# Patient Record
Sex: Male | Born: 1990 | Hispanic: Yes | Marital: Single | Smoking: Current some day smoker
Health system: Southern US, Community
[De-identification: ages and names within clinical notes are randomized; demographics above are authoritative.]

## PROBLEM LIST (undated history)

## (undated) DIAGNOSIS — F329 Major depressive disorder, single episode, unspecified: Secondary | ICD-10-CM

## (undated) DIAGNOSIS — G2579 Other drug induced movement disorders: Secondary | ICD-10-CM

## (undated) DIAGNOSIS — F32A Depression, unspecified: Secondary | ICD-10-CM

## (undated) DIAGNOSIS — K589 Irritable bowel syndrome without diarrhea: Secondary | ICD-10-CM

## (undated) DIAGNOSIS — F1910 Other psychoactive substance abuse, uncomplicated: Secondary | ICD-10-CM

## (undated) DIAGNOSIS — F640 Transsexualism: Secondary | ICD-10-CM

## (undated) DIAGNOSIS — F419 Anxiety disorder, unspecified: Secondary | ICD-10-CM

## (undated) DIAGNOSIS — J189 Pneumonia, unspecified organism: Secondary | ICD-10-CM

## (undated) DIAGNOSIS — Z789 Other specified health status: Secondary | ICD-10-CM

## (undated) HISTORY — PX: APPENDECTOMY: SHX54

---

## 2010-04-19 ENCOUNTER — Emergency Department (INDEPENDENT_AMBULATORY_CARE_PROVIDER_SITE_OTHER): Payer: Managed Care, Other (non HMO)

## 2010-04-19 ENCOUNTER — Emergency Department (HOSPITAL_BASED_OUTPATIENT_CLINIC_OR_DEPARTMENT_OTHER)
Admission: EM | Admit: 2010-04-19 | Discharge: 2010-04-19 | Disposition: A | Discharge status: Nursing Home (Includes ICF) | Payer: Managed Care, Other (non HMO) | Attending: Emergency Medicine | Admitting: Emergency Medicine

## 2010-04-19 DIAGNOSIS — R109 Unspecified abdominal pain: Secondary | ICD-10-CM

## 2010-04-19 DIAGNOSIS — K3580 Unspecified acute appendicitis: Secondary | ICD-10-CM | POA: Insufficient documentation

## 2010-04-19 LAB — BASIC METABOLIC PANEL
Calcium: 9.8 mg/dL (ref 8.4–10.5)
Chloride: 101 mEq/L (ref 96–112)
Creatinine, Ser: 1 mg/dL (ref 0.4–1.5)
GFR calc Af Amer: >60 mL/min (ref >60)

## 2010-04-19 LAB — DIFFERENTIAL
Basophils Absolute: 0 10*3/uL (ref 0.0–0.1)
Basophils Relative: 0 % (ref 0–1)
Eosinophils Absolute: 0.1 10*3/uL (ref 0.0–0.7)
Monocytes Relative: 6 % (ref 3–12)
Neutrophils Relative %: 80 % — ABNORMAL HIGH (ref 43–77)

## 2010-04-19 LAB — URINALYSIS, ROUTINE W REFLEX MICROSCOPIC
Hgb urine dipstick: NEGATIVE
Nitrite: NEGATIVE
Protein, ur: NEGATIVE mg/dL
Urobilinogen, UA: 0.2 mg/dL (ref 0.0–1.0)

## 2010-04-19 LAB — CBC
MCH: 31.4 pg (ref 26.0–34.0)
MCHC: 35 g/dL (ref 30.0–36.0)
Platelets: 184 10*3/uL (ref 150–400)
RBC: 4.24 MIL/uL (ref 4.22–5.81)

## 2010-04-19 MED ORDER — IOHEXOL 300 MG/ML  SOLN
100.0000 mL | Freq: Once | INTRAMUSCULAR | Status: AC | PRN
  Administered 2010-04-19: 100 mL via INTRAVENOUS

## 2012-10-17 ENCOUNTER — Other Ambulatory Visit: Payer: Self-pay | Admitting: Gastroenterology

## 2012-10-17 DIAGNOSIS — R109 Unspecified abdominal pain: Secondary | ICD-10-CM

## 2012-10-18 ENCOUNTER — Ambulatory Visit
Admission: RE | Admit: 2012-10-18 | Discharge: 2012-10-18 | Disposition: A | Discharge status: Home / Self Care | Payer: BC Managed Care – PPO | Source: Ambulatory Visit | Attending: Gastroenterology | Admitting: Gastroenterology

## 2012-10-18 DIAGNOSIS — R109 Unspecified abdominal pain: Secondary | ICD-10-CM

## 2012-10-18 MED ORDER — IOHEXOL 300 MG/ML  SOLN
100.0000 mL | Freq: Once | INTRAMUSCULAR | Status: AC | PRN
  Administered 2012-10-18: 100 mL via INTRAVENOUS

## 2014-04-21 ENCOUNTER — Encounter (HOSPITAL_COMMUNITY): Payer: Self-pay | Admitting: Emergency Medicine

## 2014-04-21 ENCOUNTER — Emergency Department (HOSPITAL_COMMUNITY)
Admission: EM | Admit: 2014-04-21 | Discharge: 2014-04-23 | Disposition: A | Discharge status: Home / Self Care | Payer: 59 | Attending: Emergency Medicine | Admitting: Emergency Medicine

## 2014-04-21 DIAGNOSIS — F332 Major depressive disorder, recurrent severe without psychotic features: Secondary | ICD-10-CM | POA: Diagnosis present

## 2014-04-21 DIAGNOSIS — Z720 Tobacco use: Secondary | ICD-10-CM | POA: Diagnosis not present

## 2014-04-21 DIAGNOSIS — R45851 Suicidal ideations: Secondary | ICD-10-CM

## 2014-04-21 DIAGNOSIS — Z8719 Personal history of other diseases of the digestive system: Secondary | ICD-10-CM | POA: Insufficient documentation

## 2014-04-21 HISTORY — DX: Major depressive disorder, single episode, unspecified: F32.9

## 2014-04-21 HISTORY — DX: Depression, unspecified: F32.A

## 2014-04-21 HISTORY — DX: Irritable bowel syndrome without diarrhea: K58.9

## 2014-04-21 LAB — ACETAMINOPHEN LEVEL: Acetaminophen (Tylenol), Serum: <10 ug/mL — ABNORMAL LOW (ref 10–30)

## 2014-04-21 LAB — COMPREHENSIVE METABOLIC PANEL
ALBUMIN: 4.4 g/dL (ref 3.5–5.2)
ALT: 12 U/L (ref 0–53)
ANION GAP: 8 (ref 5–15)
AST: 19 U/L (ref 0–37)
Alkaline Phosphatase: 60 U/L (ref 39–117)
BUN: 8 mg/dL (ref 6–23)
CO2: 28 mmol/L (ref 19–32)
Calcium: 9.2 mg/dL (ref 8.4–10.5)
Chloride: 102 mmol/L (ref 96–112)
Creatinine, Ser: 0.83 mg/dL (ref 0.50–1.35)
GFR calc Af Amer: >90 mL/min (ref >90)
GFR calc non Af Amer: >90 mL/min (ref >90)
Glucose, Bld: 108 mg/dL — ABNORMAL HIGH (ref 70–99)
Potassium: 3.8 mmol/L (ref 3.5–5.1)
Sodium: 138 mmol/L (ref 135–145)
TOTAL PROTEIN: 6.9 g/dL (ref 6.0–8.3)
Total Bilirubin: 0.5 mg/dL (ref 0.3–1.2)

## 2014-04-21 LAB — CBC
HCT: 38 % — ABNORMAL LOW (ref 39.0–52.0)
Hemoglobin: 13 g/dL (ref 13.0–17.0)
MCH: 31.3 pg (ref 26.0–34.0)
MCHC: 34.2 g/dL (ref 30.0–36.0)
MCV: 91.3 fL (ref 78.0–100.0)
PLATELETS: 250 10*3/uL (ref 150–400)
RBC: 4.16 MIL/uL — AB (ref 4.22–5.81)
RDW: 12 % (ref 11.5–15.5)
WBC: 6.3 10*3/uL (ref 4.0–10.5)

## 2014-04-21 LAB — RAPID URINE DRUG SCREEN, HOSP PERFORMED
Amphetamines: NOT DETECTED
BARBITURATES: POSITIVE — AB
BENZODIAZEPINES: NOT DETECTED
COCAINE: NOT DETECTED
Opiates: NOT DETECTED
Tetrahydrocannabinol: NOT DETECTED

## 2014-04-21 LAB — ETHANOL: Alcohol, Ethyl (B): <5 mg/dL (ref 0–9)

## 2014-04-21 LAB — SALICYLATE LEVEL: Salicylate Lvl: <4 mg/dL (ref 2.8–20.0)

## 2014-04-21 MED ORDER — SPIRONOLACTONE 100 MG PO TABS
100.0000 mg | ORAL_TABLET | Freq: Two times a day (BID) | ORAL | Status: DC
  Administered 2014-04-21 – 2014-04-23 (×4): 100 mg via ORAL
  Filled 2014-04-21 (×5): qty 1

## 2014-04-21 MED ORDER — SERTRALINE HCL 50 MG PO TABS
50.0000 mg | ORAL_TABLET | Freq: Every day | ORAL | Status: DC
  Administered 2014-04-21 – 2014-04-22 (×2): 50 mg via ORAL
  Filled 2014-04-21 (×2): qty 1

## 2014-04-21 MED ORDER — LORAZEPAM 0.5 MG PO TABS: 0.5000 mg | ORAL_TABLET | Freq: Four times a day (QID) | ORAL | Status: DC | PRN

## 2014-04-21 MED ORDER — ESTRADIOL 2 MG PO TABS
3.0000 mg | ORAL_TABLET | Freq: Every day | ORAL | Status: DC
  Administered 2014-04-22 – 2014-04-23 (×2): 3 mg via ORAL
  Filled 2014-04-21 (×2): qty 1

## 2014-04-21 MED ORDER — ESTRADIOL 2 MG PO TABS
2.0000 mg | ORAL_TABLET | Freq: Every day | ORAL | Status: DC
  Administered 2014-04-21 – 2014-04-22 (×2): 2 mg via ORAL
  Filled 2014-04-21 (×3): qty 1

## 2014-04-21 NOTE — ED Notes (Addendum)
Patient goes by [employer].  Reports SI, tearful. Appears flat. Reports interrupted sleep. Rates anxiety 9/10. Unable to quantify feelings of depression. Denies HI, AVH.  Encouragement offered.  Q 15 safety checks in place.  Glasses with patient.

## 2014-04-21 NOTE — ED Notes (Signed)
Pt states someone called the cops because he told someone he wanted to kill himself. Accompanied by [employer] but is voluntary. Emotional. Alert and oriented.

## 2014-04-21 NOTE — ED Provider Notes (Signed)
CSN: [id number]     Arrival date & time 04/21/14  2131 History  This chart was scribed for non-physician practitioner, Earley FavorGail Shadae Reino, NP-C working with Eber HongBrian Miller, MD, by Abel PrestoKara Demonbreun, ED Scribe. This patient was seen in room WTR5/WTR5 and the patient's care was started at 9:40 PM.      Chief Complaint  Patient presents with  . Suicidal    HPI HPI Comments: [name] is a 24 y.o. male who presents to the Emergency Department complaining of suicidal ideation. Pt escorted by [employer]. Pt states he was going to hang himself. Pt notes past self injury, showing several scars from wrist cutting. Pt takes medication for his depression and has been compliant. Pt was last seen by Dr. Theodoro Gristho and prescribed medication for his depression in September 2015.    Past Medical History  Diagnosis Date  . Depression   . IBS (irritable bowel syndrome)    Past Surgical History  Procedure Laterality Date  . Appendectomy     History reviewed. No pertinent family history. History  Substance Use Topics  . Smoking status: Current Some Day Smoker  . Smokeless tobacco: Not on file  . Alcohol Use: Yes    Review of Systems  Psychiatric/Behavioral: Positive for suicidal ideas, self-injury, dysphoric mood and agitation.  All other systems reviewed and are negative.     Allergies  Review of patient's allergies indicates no known allergies.  Home Medications   Prior to Admission medications   Not on File   BP 147/81 mmHg  Pulse 126  Temp(Src) 98.2 F (36.8 C) (Oral)  SpO2 99% Physical Exam  Constitutional: He is oriented to person, place, and time. He appears well-developed and well-nourished.  HENT:  Head: Normocephalic.  Eyes: Conjunctivae are normal.  Neck: Normal range of motion. Neck supple.  Pulmonary/Chest: Effort normal.  Musculoskeletal: Normal range of motion.  Neurological: He is alert and oriented to person, place, and time.  Skin: Skin is warm and dry.  Psychiatric: He has a normal  mood and affect. His behavior is normal.  Nursing note and vitals reviewed.   ED Course  Procedures (including critical care time) DIAGNOSTIC STUDIES: Oxygen Saturation is 99% on room air, normal by my interpretation.    COORDINATION OF CARE: 9:44 PM Discussed treatment plan with patient at beside, the patient agrees with the plan and has no further questions at this time.   Labs Review Labs Reviewed - No data to display  Imaging Review No results found.   EKG Interpretation None     patient will not sign in or be admitted voluntarily.  He is going to wait for psychiatric evaluation in the morning  MDM   Final diagnoses:  None    I personally performed the services described in this documentation, which was scribed in my presence. The recorded information has been reviewed and is accurate.    Earley FavorGail Odilia Damico, NP 04/22/14 0505  Eber HongBrian Miller, MD 04/22/14 [phone]

## 2014-04-21 NOTE — ED Notes (Signed)
Dondra SpryGail NP aware of HR.

## 2014-04-22 DIAGNOSIS — F332 Major depressive disorder, recurrent severe without psychotic features: Secondary | ICD-10-CM | POA: Diagnosis not present

## 2014-04-22 MED ORDER — PB-HYOSCY-ATROPINE-SCOPOLAMINE 16.2 MG PO TABS
1.0000 | ORAL_TABLET | Freq: Four times a day (QID) | ORAL | Status: DC | PRN
  Filled 2014-04-22: qty 1

## 2014-04-22 MED ORDER — PB-HYOSCY-ATROPINE-SCOPOLAMINE 16.2 MG/5ML PO ELIX
5.0000 mL | ORAL_SOLUTION | Freq: Four times a day (QID) | ORAL | Status: DC | PRN
  Filled 2014-04-22: qty 5

## 2014-04-22 NOTE — BH Assessment (Signed)
[facility] Assessment Progress Note   The following facilities have been contacted to seek placement for this pt, with results as noted:  Beds available, information faxed, decision pending:  [facility]  [facility]  Doylene Canninghomas Sipriano Fendley, [place] Triage Specialist 04/22/2014 @ 16:00

## 2014-04-22 NOTE — ED Notes (Signed)
Acuity note: Patient calm and cooperative.  No acute distress noted.

## 2014-04-22 NOTE — ED Notes (Signed)
Writer went in to talk with patient and patient stressed how she needed to talk to someone about how she was feeling. Patient had explained how her boyfriend had broke up with her on February 6 and she has not had any contact with her even though she has tried to contact him numerous of times. She was really upset because he has moved in with someone else and she feels that he is with the other person. She has not slept because she is so hurt that he is ignoring her calls they was together for 3 years and he just all of a sudden out of nowhere broke up with her through a text. She was trembling while she was talking to Clinical research associatewriter, Clinical research associatewriter asked if she wanted me to let the nurse know and maybe she can help her she said no she just needed to talk. RN Aundra MilletMegan was notified of the conversation

## 2014-04-22 NOTE — Consult Note (Signed)
[facility] Consult   Reason for Consult:  Suicidal ideation, Major depression Referring Physician:  EDP Patient Identification: Demontre Padin MRN:  [id number] Principal Diagnosis: Severe recurrent major depression without psychotic features Diagnosis:   Patient Active Problem List   Diagnosis Date Noted  . Severe recurrent major depression without psychotic features [F33.2] 04/22/2014    Priority: High    Total Time spent with patient: 1 hour  Subjective:   Elgie Maziarz is a 24 y.o. male patient admitted with Suicidal ideation, severe.  HPI: Patient, male 24 years old was evaluated for severe depression and suicidal ideation to hang herself.  Patient is a trans gender  Male who stated that she is suicidal because her fiance left her.  Patient stated that she tried to commit suicide last week by making superficial cuts to her right wrist.   Patient also admitted to a diagnosis of depression and that she is on medications.  Patient reports poor sleep and appetite.  Patient reported that she has been depressed since her [age]ennage age.  She denies SI/HI/AVH and she denies drug or Alcohol use.  Patient has been accepted for admission for safety and stabilization based on her emotional state of mind and poor coping for her loss of a relationship.  We have resumed her home medications while we wait for admission bed.  HPI Elements:   Location:  Recurrent Major depression, Suicidal ideation. Quality:  severe, cut wrist last week, planned to hang self yesterday. Severity:  severe. Timing:  Acute. Duration:  Chronic mental illness, adjustment disorder/ r/o. Context:  Seeking treatment for suicidal ideation..  Past Medical History:  Past Medical History  Diagnosis Date  . Depression   . IBS (irritable bowel syndrome)     Past Surgical History  Procedure Laterality Date  . Appendectomy     Family History: History reviewed. No pertinent family history. Social History:   History  Alcohol Use  . Yes     History  Drug Use  . Yes  . Special: Marijuana    History   Social History  . Marital Status: Single    Spouse Name: N/A  . Number of Children: N/A  . Years of Education: N/A   Social History Main Topics  . Smoking status: Current Some Day Smoker  . Smokeless tobacco: Not on file  . Alcohol Use: Yes  . Drug Use: Yes    Special: Marijuana  . Sexual Activity: Not on file   Other Topics Concern  . None   Social History Narrative  . None   Additional Social History:    Prescriptions: See PTA list History of alcohol / drug use?: Yes Longest period of sobriety (when/how long): 6 months Negative Consequences of Use: Personal relationships Name of Substance 1: Alcohol 1 - Age of First Use: 18 1 - Amount (size/oz): 1 glass to 3/4 bottle of wine 1 - Frequency: daily at times; 1 x week at other times 1 - Duration: since started drinking 1 - Last Use / Amount: last week, Wed ot [place] Name of Substance 2: Marijuana 2 - Age of First Use: 19 2 - Amount (size/oz): unknown 2 - Frequency: 1x every 2 months 2 - Duration: last few years 2 - Last Use / Amount: can't remember                 Allergies:  No Known Allergies  Labs:  Results for orders placed or performed during the hospital encounter of 04/21/14 (from the  past 48 hour(s))  Acetaminophen level     Status: Abnormal   Collection Time: 04/21/14 10:06 PM  Result Value Ref Range   Acetaminophen (Tylenol), Serum <10.0 (L) 10 - 30 ug/mL    Comment:        THERAPEUTIC CONCENTRATIONS VARY SIGNIFICANTLY. A RANGE OF 10-30 ug/mL MAY BE AN EFFECTIVE CONCENTRATION FOR MANY PATIENTS. HOWEVER, SOME ARE BEST TREATED AT CONCENTRATIONS OUTSIDE THIS RANGE. ACETAMINOPHEN CONCENTRATIONS >150 ug/mL AT 4 HOURS AFTER INGESTION AND >50 ug/mL AT 12 HOURS AFTER INGESTION ARE OFTEN ASSOCIATED WITH TOXIC REACTIONS.   CBC     Status: Abnormal   Collection Time: 04/21/14 10:06 PM  Result Value  Ref Range   WBC 6.3 4.0 - 10.5 K/uL   RBC 4.16 (L) 4.22 - 5.81 MIL/uL   Hemoglobin 13.0 13.0 - 17.0 g/dL   HCT 38.0 (L) 39.0 - 52.0 %   MCV 91.3 78.0 - 100.0 fL   MCH 31.3 26.0 - 34.0 pg   MCHC 34.2 30.0 - 36.0 g/dL   RDW 12.0 11.5 - 15.5 %   Platelets 250 150 - 400 K/uL  Comprehensive metabolic panel     Status: Abnormal   Collection Time: 04/21/14 10:06 PM  Result Value Ref Range   Sodium 138 135 - 145 mmol/L   Potassium 3.8 3.5 - 5.1 mmol/L   Chloride 102 96 - 112 mmol/L   CO2 28 19 - 32 mmol/L   Glucose, Bld 108 (H) 70 - 99 mg/dL   BUN 8 6 - 23 mg/dL   Creatinine, Ser 0.83 0.50 - 1.35 mg/dL   Calcium 9.2 8.4 - 10.5 mg/dL   Total Protein 6.9 6.0 - 8.3 g/dL   Albumin 4.4 3.5 - 5.2 g/dL   AST 19 0 - 37 U/L   ALT 12 0 - 53 U/L   Alkaline Phosphatase 60 39 - 117 U/L   Total Bilirubin 0.5 0.3 - 1.2 mg/dL   GFR calc non Af Amer >90 >90 mL/min   GFR calc Af Amer >90 >90 mL/min    Comment: (NOTE) The eGFR has been calculated using the CKD EPI equation. This calculation has not been validated in all clinical situations. eGFR's persistently <90 mL/min signify possible Chronic Kidney Disease.    Anion gap 8 5 - 15  Ethanol (ETOH)     Status: None   Collection Time: 04/21/14 10:06 PM  Result Value Ref Range   Alcohol, Ethyl (B) <5 0 - 9 mg/dL    Comment:        LOWEST DETECTABLE LIMIT FOR SERUM ALCOHOL IS 11 mg/dL FOR MEDICAL PURPOSES ONLY   Salicylate level     Status: None   Collection Time: 04/21/14 10:06 PM  Result Value Ref Range   Salicylate Lvl <9.9 2.8 - 20.0 mg/dL  Urine Drug Screen     Status: Abnormal   Collection Time: 04/21/14 10:27 PM  Result Value Ref Range   Opiates NONE DETECTED NONE DETECTED   Cocaine NONE DETECTED NONE DETECTED   Benzodiazepines NONE DETECTED NONE DETECTED   Amphetamines NONE DETECTED NONE DETECTED   Tetrahydrocannabinol NONE DETECTED NONE DETECTED   Barbiturates POSITIVE (A) NONE DETECTED    Comment:        DRUG SCREEN FOR MEDICAL  PURPOSES ONLY.  IF CONFIRMATION IS NEEDED FOR ANY PURPOSE, NOTIFY LAB WITHIN 5 DAYS.        LOWEST DETECTABLE LIMITS FOR URINE DRUG SCREEN Drug Class       Cutoff (ng/mL) Amphetamine  1000 Barbiturate      200 Benzodiazepine   026 Tricyclics       378 Opiates          300 Cocaine          300 THC              50     Vitals: Blood pressure 99/58, pulse 84, temperature 98.6 F (37 C), temperature source Oral, resp. rate 16, SpO2 100 %.  Risk to Self: Suicidal Ideation: No-Not Currently/Within Last 6 Months (earlier today) Suicidal Intent: No-Not Currently/Within Last 6 Months Is patient at risk for suicide?: No (Pt stated he "feels like an idiot" ) Suicidal Plan?: No-Not Currently/Within Last 6 Months Specify Current Suicidal Plan: hanging self Access to Means: Yes Specify Access to Suicidal Means: rope What has been your use of drugs/alcohol within the last 12 months?: weekly or daily How many times?: 5 Other Self Harm Risks: cutting Triggers for Past Attempts: Other personal contacts (romantic breakups) Intentional Self Injurious Behavior: Cutting Comment - Self Injurious Behavior: tried to kill himself once by citting wrists Risk to Others: Homicidal Ideation: No (denies) Thoughts of Harm to Others: No (denies) Current Homicidal Intent: No Current Homicidal Plan: No Access to Homicidal Means: No Identified Victim: no History of harm to others?: No Assessment of Violence: None Noted Violent Behavior Description: na Does patient have access to weapons?: No Criminal Charges Pending?: No Does patient have a court date: No Prior Inpatient Therapy: Prior Inpatient Therapy: No Prior Therapy Dates: na Prior Therapy Facilty/Provider(s): na Reason for Treatment: na Prior Outpatient Therapy: Prior Outpatient Therapy: Yes Prior Therapy Dates: 2014, 2015 Prior Therapy Facilty/Provider(s): unknown Reason for Treatment: Depression, SI, gender issues  Current  Facility-Administered Medications  Medication Dose Route Frequency Provider Last Rate Last Dose  . belladonna-PHENObarbital (DONNATAL) 16.2 MG/5ML elixir 16.2 mg  5 mL Oral QID PRN Sereniti Wan      . estradiol (ESTRACE) tablet 2 mg  2 mg Oral QHS Junius Creamer, NP   2 mg at 04/21/14 2341  . estradiol (ESTRACE) tablet 3 mg  3 mg Oral Daily Noemi Chapel, MD   3 mg at 04/22/14 0958  . LORazepam (ATIVAN) tablet 0.5 mg  0.5 mg Oral Q6H PRN Junius Creamer, NP      . sertraline (ZOLOFT) tablet 50 mg  50 mg Oral QHS Junius Creamer, NP   50 mg at 04/21/14 2342  . spironolactone (ALDACTONE) tablet 100 mg  100 mg Oral BID Junius Creamer, NP   100 mg at 04/22/14 5885   Current Outpatient Prescriptions  Medication Sig Dispense Refill  . belladonna-PHENObarbital (DONNATAL) 16.2 MG/5ML ELIX Take 5 mLs by mouth 2 (two) times daily as needed for cramping.    Marland Kitchen estradiol (ESTRACE) 1 MG tablet Take 2-3 mg by mouth daily. Takes three in the am and two tablets at night.    Marland Kitchen ibuprofen (ADVIL,MOTRIN) 200 MG tablet Take 400 mg by mouth every 6 (six) hours as needed for headache or moderate pain.    Marland Kitchen sertraline (ZOLOFT) 50 MG tablet Take 50 mg by mouth at bedtime.    Marland Kitchen spironolactone (ALDACTONE) 100 MG tablet Take 100 mg by mouth 2 (two) times daily.      Musculoskeletal: Strength & Muscle Tone: within normal limits Gait & Station: normal Patient leans: N/A  Psychiatric Specialty Exam:     Blood pressure 99/58, pulse 84, temperature 98.6 F (37 C), temperature source Oral, resp. rate 16, SpO2 100 %.There  is no height or weight on file to calculate BMI.  General Appearance: Casual  Eye Contact::  Fair  Speech:  Clear and Coherent and Normal Rate  Volume:  Normal  Mood:  Angry, Anxious, Depressed and Hopeless  Affect:  Congruent, Depressed, Flat and Tearful  Thought Process:  Coherent, Goal Directed and Intact  Orientation:  Full (Time, Place, and Person)  Thought Content:  WDL  Suicidal Thoughts:  No   Homicidal Thoughts:  No  Memory:  Immediate;   Good Recent;   Good Remote;   Good  Judgement:  Poor  Insight:  Shallow  Psychomotor Activity:  Normal  Concentration:  Fair  Recall:  NA  Fund of Knowledge:Good  Language: Good  Akathisia:  NA  Handed:  Right  AIMS (if indicated):     Assets:  Desire for Improvement  ADL's:  Intact  Cognition: WNL  Sleep:      Medical Decision Making: Review of Psycho-Social Stressors (1), Established Problem, Worsening (2), Review of Medication Regimen & Side Effects (2) and Review of New Medication or Change in Dosage (2)  Treatment Plan Summary: Daily contact with patient to assess and evaluate symptoms and progress in treatment, Medication management and Plan Admit and wait for bed placement  Plan:  Recommend psychiatric Inpatient admission when medically cleared. Disposition: see above  Delfin Gant    PMHNP-BC 04/22/2014 3:36 PM Patient seen face-to-face for psychiatric evaluation, chart reviewed and case discussed with the physician extender and developed treatment plan. Reviewed the information documented and agree with the treatment plan. Corena Pilgrim, MD
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
## 2014-04-22 NOTE — ED Notes (Signed)
Pt AAO x 3, no distress noted, family visiting at present, remains passive SI, no specific plan. Monitoring for safety, q 15 min checks in effect.

## 2014-04-22 NOTE — ED Notes (Addendum)
Pt sleeping at present,easily arouseable to verbal stimuli, no distress noted, calm & cooperative, monitoring for safety, Q15 min checks in effect.

## 2014-04-22 NOTE — ED Notes (Signed)
Acuity note:  Pt is calm and cooperative.  NAD.

## 2014-04-22 NOTE — Progress Notes (Signed)
  CARE MANAGEMENT ED NOTE 04/22/2014  Patient:  Miguel [name],Miguel [name]   Account Number:  [id number]  Date Initiated:  04/22/2014  Documentation initiated by:  Edd ArbourGIBBS,Prezley Qadir  Subjective/Objective Assessment:   24 yr old [employer] managed c/osevere depression & suicidal ideation to hang herself.  Trans gender  Male who stated that she is suicidal because her fiance left her. 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  no pcp     Action/Plan:   CM spoke with pt about pcp confirmed no pcp but states able to obtained one using information provided by pt see notes below   Action/Plan Detail:   Anticipated DC Date:       Status Recommendation to Physician:   Result of Recommendation:    Other ED Services  Consult Working Plan    DC Associate Professorlanning Services  Other  Outpatient Services - Pt will follow up  PCP issues    Choice offered to / List presented to:            Status of service:  Completed, signed off  ED Comments:   ED Comments Detail:  [facility] ED CM spoke with pt on how to obtain an in network pcp with insurance coverage via the customer service number or web site Cm reviewed ED level of care for crisis/emergent services and community pcp level of care to manage continuous or chronic medical concerns.  The pt voiced understanding CM encouraged pt and discussed pt's responsibility to verify with pt's insurance carrier that any recommended medical provider offered by any emergency room or a hospital provider is within the carrier's network. The pt voiced understanding

## 2014-04-22 NOTE — ED Provider Notes (Signed)
Mary from [facility] has evaluated the patient. She states they recommend inpatient treatment however patient states he is embarrassed that he is here and does not want to voluntarily sign himself into a psychiatric facility. She recommends having the psychiatrist reevaluate patient in the morning. Patient evidently was threatening to hang himself tonight and has tried to hang himself in the past.  Devoria AlbeIva Lydia Meng, MD, Concha PyoFACEP   Jovonta Levit, MD 04/22/14 [phone]

## 2014-04-23 DIAGNOSIS — R45851 Suicidal ideations: Secondary | ICD-10-CM | POA: Diagnosis not present

## 2014-04-23 DIAGNOSIS — F332 Major depressive disorder, recurrent severe without psychotic features: Secondary | ICD-10-CM | POA: Diagnosis not present

## 2014-04-23 NOTE — [facility] Suicide Risk Assessment (Signed)
[facility]   Demographic Factors:  24 year old transgender male  Total Time spent with patient: 45 minutes  Musculoskeletal: Strength & Muscle Tone: within normal limits Gait & Station: normal Patient leans: N/A  Psychiatric Specialty Exam: Physical Exam  ROS  Blood pressure 97/64, pulse 79, temperature 98.2 F (36.8 C), temperature source Oral, resp. rate 14, SpO2 100 %.There is no height or weight on file to calculate BMI.  General Appearance: Well Groomed  Patent attorneyye Contact::  Good  Speech:  Clear and Coherent409  Volume:  Normal  Mood:  Anxious  Affect:  Appropriate  Thought Process:  Coherent and Logical  Orientation:  Full (Time, Place, and Person)  Thought Content:  Negative  Suicidal Thoughts:  No  Homicidal Thoughts:  No  Memory:  Immediate;   Good Recent;   Good Remote;   Good  Judgement:  Intact  Insight:  Fair  Psychomotor Activity:  Normal  Concentration:  Good  Recall:  Good  Fund of Knowledge:Good  Language: Good  Akathisia:  Negative  Handed:  Right  AIMS (if indicated):     Assets:  Communication Skills Desire for Improvement Financial Resources/Insurance Housing Intimacy Leisure Time Physical Health Resilience Social Support Talents/Skills Transportation Vocational/Educational  Sleep:     Cognition: WNL  ADL's:  Intact      Has this patient used any form of tobacco in the last 30 days? (Cigarettes, Smokeless Tobacco, Cigars, and/or Pipes) N/A  Mental Status Per Nursing Assessment::   On Admission:     Current Mental Status by Physician: no suicidal ideation  Loss Factors: issues with her fiance  Historical Factors: Impulsivity  Risk Reduction Factors:   Sense of responsibility to family, Living with another person, especially a relative, Positive social support and Positive coping skills or problem solving skills  Continued Clinical Symptoms:  Still anxious about relationship with her  fiance  Cognitive Features That Contribute To Risk:  None    Suicide Risk:  Minimal: No identifiable suicidal ideation.  Patients presenting with no risk factors but with morbid ruminations; may be classified as minimal risk based on the severity of the depressive symptoms  Principal Problem: Severe recurrent major depression without psychotic features Discharge Diagnoses:  Patient Active Problem List   Diagnosis Date Noted  . Severe recurrent major depression without psychotic features [F33.2] 04/22/2014      Plan Of Care/Follow-up recommendations:  Activity:  resume usual activity Diet:  resume usual diet  Is patient on multiple antipsychotic therapies at discharge:  No   Has Patient had three or more failed trials of antipsychotic monotherapy by history:  No  Recommended Plan for Multiple Antipsychotic Therapies: NA    Cheryll Keisler D 04/23/2014, 12:44 PM

## 2014-04-23 NOTE — Discharge Instructions (Signed)
For your ongoing behavioral health needs, you are scheduled to start the Mental Health Intensive Outpatient Program (MH[facility]IOP) [facility] [place].  The program meets Monday - Friday from 9:00 am - 12:00.  Your scheduled start date is Monday, May 04, 2014.  For the first session, plan to be there at 8:45 am.  Be sure to have your health insurance card with you.  If you have any questions, contact Jeri Modenaita Clark, MEd at the number below:       [facility]      [address]      [place], [place] [address]      [phone]      Contact person: Jeri Modenaita Clark, [place]

## 2014-04-23 NOTE — ED Notes (Signed)
Pt sleeping at present, no distress noted, monitoring for safety, Q15 min checks in effect. 

## 2014-04-23 NOTE — ED Notes (Signed)
Patient discharged to home with outpatient follow up services.  She denies suicidal ideation or thoughts of hurting others.  All belongings returned and signed for.  Left the unit ambulatory to meet mother in the front lobby.

## 2014-04-23 NOTE — Consult Note (Signed)
[facility] Consult   Reason for Consult:  Suicidal ideation, Major depression Referring Physician:  EDP Patient Identification: Miguel [name] MRN:  [id number] Principal Diagnosis: Severe recurrent major depression without psychotic features Diagnosis:   Patient Active Problem List   Diagnosis Date Noted  . Severe recurrent major depression without psychotic features [F33.2] 04/22/2014    Total Time spent with patient: 45 minutes  Subjective:   Miguel [name] is a 24 y.o. male patient admitted with Suicidal ideation, severe.  HPI: Patient, male 24 years old was evaluated for severe depression and suicidal ideation to hang herself.  Patient is a trans gender  Male who stated that she is suicidal because her fiance left her.  Patient stated that she tried to commit suicide last week by making superficial cuts to her right wrist.   Patient also admitted to a diagnosis of depression and that she is on medications.  Patient reports poor sleep and appetite.  Patient reported that she has been depressed since her teennage age.  She denies SI/HI/AVH and she denies drug or Alcohol use.  Patient has been accepted for admission for safety and stabilization based on her emotional state of mind and poor coping for her loss of a relationship.  We have resumed her home medications while we wait for admission bed.  04/23/2014  Miguel [name] says she is not suicidal and wants to be discharged.  Her mother came in and I met with her and she is comfortable having Miguel [name] home.  She will not be alone for the next week and will be with family.  Miguel [name] is happy with that plan.      Past Medical History:  Past Medical History  Diagnosis Date  . Depression   . IBS (irritable bowel syndrome)     Past Surgical History  Procedure Laterality Date  . Appendectomy     Family History: History reviewed. No pertinent family history. Social History:  History  Alcohol Use  . Yes     History  Drug Use   . Yes  . Special: Marijuana    History   Social History  . Marital Status: Single    Spouse Name: N/A  . Number of Children: N/A  . Years of Education: N/A   Social History Main Topics  . Smoking status: Current Some Day Smoker  . Smokeless tobacco: Not on file  . Alcohol Use: Yes  . Drug Use: Yes    Special: Marijuana  . Sexual Activity: Not on file   Other Topics Concern  . None   Social History Narrative  . None   Additional Social History:    Prescriptions: See PTA list History of alcohol / drug use?: Yes Longest period of sobriety (when/how long): 6 months Negative Consequences of Use: Personal relationships Name of Substance 1: Alcohol 1 - Age of First Use: 18 1 - Amount (size/oz): 1 glass to 3/4 bottle of wine 1 - Frequency: daily at times; 1 x week at other times 1 - Duration: since started drinking 1 - Last Use / Amount: last week, Wed ot [place] Name of Substance 2: Marijuana 2 - Age of First Use: 19 2 - Amount (size/oz): unknown 2 - Frequency: 1x every 2 months 2 - Duration: last few years 2 - Last Use / Amount: can't remember                 Allergies:  No Known Allergies  Labs:  Results for orders placed or  performed during the hospital encounter of 04/21/14 (from the past 48 hour(s))  Acetaminophen level     Status: Abnormal   Collection Time: 04/21/14 10:06 PM  Result Value Ref Range   Acetaminophen (Tylenol), Serum <10.0 (L) 10 - 30 ug/mL    Comment:        THERAPEUTIC CONCENTRATIONS VARY SIGNIFICANTLY. A RANGE OF 10-30 ug/mL MAY BE AN EFFECTIVE CONCENTRATION FOR MANY PATIENTS. HOWEVER, SOME ARE BEST TREATED AT CONCENTRATIONS OUTSIDE THIS RANGE. ACETAMINOPHEN CONCENTRATIONS >150 ug/mL AT 4 HOURS AFTER INGESTION AND >50 ug/mL AT 12 HOURS AFTER INGESTION ARE OFTEN ASSOCIATED WITH TOXIC REACTIONS.   CBC     Status: Abnormal   Collection Time: 04/21/14 10:06 PM  Result Value Ref Range   WBC 6.3 4.0 - 10.5 K/uL   RBC 4.16 (L)  4.22 - 5.81 MIL/uL   Hemoglobin 13.0 13.0 - 17.0 g/dL   HCT 38.0 (L) 39.0 - 52.0 %   MCV 91.3 78.0 - 100.0 fL   MCH 31.3 26.0 - 34.0 pg   MCHC 34.2 30.0 - 36.0 g/dL   RDW 12.0 11.5 - 15.5 %   Platelets 250 150 - 400 K/uL  Comprehensive metabolic panel     Status: Abnormal   Collection Time: 04/21/14 10:06 PM  Result Value Ref Range   Sodium 138 135 - 145 mmol/L   Potassium 3.8 3.5 - 5.1 mmol/L   Chloride 102 96 - 112 mmol/L   CO2 28 19 - 32 mmol/L   Glucose, Bld 108 (H) 70 - 99 mg/dL   BUN 8 6 - 23 mg/dL   Creatinine, Ser 0.83 0.50 - 1.35 mg/dL   Calcium 9.2 8.4 - 10.5 mg/dL   Total Protein 6.9 6.0 - 8.3 g/dL   Albumin 4.4 3.5 - 5.2 g/dL   AST 19 0 - 37 U/L   ALT 12 0 - 53 U/L   Alkaline Phosphatase 60 39 - 117 U/L   Total Bilirubin 0.5 0.3 - 1.2 mg/dL   GFR calc non Af Amer >90 >90 mL/min   GFR calc Af Amer >90 >90 mL/min    Comment: (NOTE) The eGFR has been calculated using the CKD EPI equation. This calculation has not been validated in all clinical situations. eGFR's persistently <90 mL/min signify possible Chronic Kidney Disease.    Anion gap 8 5 - 15  Ethanol (ETOH)     Status: None   Collection Time: 04/21/14 10:06 PM  Result Value Ref Range   Alcohol, Ethyl (B) <5 0 - 9 mg/dL    Comment:        LOWEST DETECTABLE LIMIT FOR SERUM ALCOHOL IS 11 mg/dL FOR MEDICAL PURPOSES ONLY   Salicylate level     Status: None   Collection Time: 04/21/14 10:06 PM  Result Value Ref Range   Salicylate Lvl <4.0 2.8 - 20.0 mg/dL  Urine Drug Screen     Status: Abnormal   Collection Time: 04/21/14 10:27 PM  Result Value Ref Range   Opiates NONE DETECTED NONE DETECTED   Cocaine NONE DETECTED NONE DETECTED   Benzodiazepines NONE DETECTED NONE DETECTED   Amphetamines NONE DETECTED NONE DETECTED   Tetrahydrocannabinol NONE DETECTED NONE DETECTED   Barbiturates POSITIVE (A) NONE DETECTED    Comment:        DRUG SCREEN FOR MEDICAL PURPOSES ONLY.  IF CONFIRMATION IS NEEDED FOR ANY  PURPOSE, NOTIFY LAB WITHIN 5 DAYS.        LOWEST DETECTABLE LIMITS FOR URINE DRUG SCREEN Drug Class  Cutoff (ng/mL) Amphetamine      1000 Barbiturate      200 Benzodiazepine   829 Tricyclics       562 Opiates          300 Cocaine          300 THC              50     Vitals: Blood pressure 97/64, pulse 79, temperature 98.2 F (36.8 C), temperature source Oral, resp. rate 14, SpO2 100 %.  Risk to Self: Suicidal Ideation: No-Not Currently/Within Last 6 Months (earlier today) Suicidal Intent: No-Not Currently/Within Last 6 Months Is patient at risk for suicide?: No (Pt stated he "feels like an idiot" ) Suicidal Plan?: No-Not Currently/Within Last 6 Months Specify Current Suicidal Plan: hanging self Access to Means: Yes Specify Access to Suicidal Means: rope What has been your use of drugs/alcohol within the last 12 months?: weekly or daily How many times?: 5 Other Self Harm Risks: cutting Triggers for Past Attempts: Other personal contacts (romantic breakups) Intentional Self Injurious Behavior: Cutting Comment - Self Injurious Behavior: tried to kill himself once by citting wrists Risk to Others: Homicidal Ideation: No (denies) Thoughts of Harm to Others: No (denies) Current Homicidal Intent: No Current Homicidal Plan: No Access to Homicidal Means: No Identified Victim: no History of harm to others?: No Assessment of Violence: None Noted Violent Behavior Description: na Does patient have access to weapons?: No Criminal Charges Pending?: No Does patient have a court date: No Prior Inpatient Therapy: Prior Inpatient Therapy: No Prior Therapy Dates: na Prior Therapy Facilty/Provider(s): na Reason for Treatment: na Prior Outpatient Therapy: Prior Outpatient Therapy: Yes Prior Therapy Dates: 2014, 2015 Prior Therapy Facilty/Provider(s): unknown Reason for Treatment: Depression, SI, gender issues  Current Facility-Administered Medications  Medication Dose Route  Frequency Provider Last Rate Last Dose  . belladonna-PHENObarbital (DONNATAL) 16.2 MG/5ML elixir 16.2 mg  5 mL Oral QID PRN Mojeed Akintayo      . estradiol (ESTRACE) tablet 2 mg  2 mg Oral QHS Junius Creamer, NP   2 mg at 04/22/14 2113  . estradiol (ESTRACE) tablet 3 mg  3 mg Oral Daily Noemi Chapel, MD   3 mg at 04/23/14 1001  . LORazepam (ATIVAN) tablet 0.5 mg  0.5 mg Oral Q6H PRN Junius Creamer, NP      . sertraline (ZOLOFT) tablet 50 mg  50 mg Oral QHS Junius Creamer, NP   50 mg at 04/22/14 2113  . spironolactone (ALDACTONE) tablet 100 mg  100 mg Oral BID Junius Creamer, NP   100 mg at 04/23/14 1001   Current Outpatient Prescriptions  Medication Sig Dispense Refill  . belladonna-PHENObarbital (DONNATAL) 16.2 MG/5ML ELIX Take 5 mLs by mouth 2 (two) times daily as needed for cramping.    Marland Kitchen estradiol (ESTRACE) 1 MG tablet Take 2-3 mg by mouth daily. Takes three in the am and two tablets at night.    Marland Kitchen ibuprofen (ADVIL,MOTRIN) 200 MG tablet Take 400 mg by mouth every 6 (six) hours as needed for headache or moderate pain.    Marland Kitchen sertraline (ZOLOFT) 50 MG tablet Take 50 mg by mouth at bedtime.    Marland Kitchen spironolactone (ALDACTONE) 100 MG tablet Take 100 mg by mouth 2 (two) times daily.      Musculoskeletal: Strength & Muscle Tone: within normal limits Gait & Station: normal Patient leans: N/A  Psychiatric Specialty Exam:     Blood pressure 97/64, pulse 79, temperature 98.2 F (36.8 C), temperature  source Oral, resp. rate 14, SpO2 100 %.There is no height or weight on file to calculate BMI.  General Appearance: Casual  Eye Contact::  Fair  Speech:  Clear and Coherent and Normal Rate  Volume:  Normal  Mood:  anxious  Affect:  appropriate  Thought Process:  Coherent, Goal Directed and Intact  Orientation:  Full (Time, Place, and Person)  Thought Content:  WDL  Suicidal Thoughts:  No  Homicidal Thoughts:  No  Memory:  Immediate;   Good Recent;   Good Remote;   Good  Judgement:  intact  Insight:   Shallow  Psychomotor Activity:  Normal  Concentration:  good  Recall:  NA  Fund of Knowledge:Good  Language: Good  Akathisia:  NA  Handed:  Right  AIMS (if indicated):     Assets:  Desire for Improvement  ADL's:  Intact  Cognition: WNL  Sleep:      Medical Decision Making: met with mother, reviewed records, consulted with provider who saw her yesterday as well as meeting with patient  Treatment Plan Summary: Plan to dc today with spending supervised time with her family and an IOP referral  Plan:  Discharge to the care of her mother Disposition: see above  Clarene Reamer    PMHNP-BC 04/23/2014 12:50 PM

## 2014-05-07 ENCOUNTER — Encounter (HOSPITAL_COMMUNITY): Payer: Self-pay | Admitting: Clinical

## 2014-05-07 ENCOUNTER — Ambulatory Visit (INDEPENDENT_AMBULATORY_CARE_PROVIDER_SITE_OTHER): Payer: PRIVATE HEALTH INSURANCE | Admitting: Clinical

## 2014-05-07 DIAGNOSIS — F411 Generalized anxiety disorder: Secondary | ICD-10-CM | POA: Diagnosis not present

## 2014-05-07 DIAGNOSIS — F329 Major depressive disorder, single episode, unspecified: Secondary | ICD-10-CM

## 2014-05-07 DIAGNOSIS — F5000 Anorexia nervosa, unspecified: Secondary | ICD-10-CM

## 2014-05-07 DIAGNOSIS — F332 Major depressive disorder, recurrent severe without psychotic features: Secondary | ICD-10-CM
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
## 2014-05-13 ENCOUNTER — Ambulatory Visit (INDEPENDENT_AMBULATORY_CARE_PROVIDER_SITE_OTHER): Payer: PRIVATE HEALTH INSURANCE | Admitting: Clinical

## 2014-05-13 ENCOUNTER — Encounter (HOSPITAL_COMMUNITY): Payer: Self-pay | Admitting: Clinical

## 2014-05-13 DIAGNOSIS — F5000 Anorexia nervosa, unspecified: Secondary | ICD-10-CM

## 2014-05-13 DIAGNOSIS — F411 Generalized anxiety disorder: Secondary | ICD-10-CM | POA: Diagnosis not present

## 2014-05-13 DIAGNOSIS — F332 Major depressive disorder, recurrent severe without psychotic features: Secondary | ICD-10-CM

## 2014-05-13 NOTE — Progress Notes (Signed)
   THERAPIST PROGRESS NOTE  Session Time: 4:32 - 5:40  Participation Level: Active  Behavioral Response: CasualAlertAnxious and Depressed  Type of Therapy: Individual Therapy  Treatment Goals addressed: improve psychiatric symptoms, Improve unhealthy thinking patterns  Interventions: CBT, motivational interviewing, and grounding and mindfulness techniques  Summary: Miguel [name] is a 24 y.o. male who presents with Major Depressive Disorder, recurrent, severe, Generalized Anxiety Disorder, Anorexia Nervosa.   Suicidal/Homicidal: Nowithout intent/plan [name] reports passive thoughts about death without intent or a plan.   Therapist Response:  Elita Quick met with clinician for an individual session. [name] shared about his psychiatric symptoms and his current life events. [name] reported that he cries often when he thinks about the break up with his boyfriend. He shared that the boyfriend has completely cut of ties and that [place] reviews (in his head) over and over the things he could have done better. [name] was tearful off and on through the session. Client and clinician used cbt to break down his negative automatic thoughts and after examining the evidence [name] was able to formulate healthier alternative thoughts. He reported a 50% decrease in negative emotions. Client and clinician discussed how his actions would change if he believed the healthier alternative thoughts. [name] and clinician discussed the importance of learning from our mistakes and also how dwelling gets in the way of that process. Clinician introduced grounding techniques and client and clinician practiced some together. [name] agreed to practice his grounding techniques until next session and to complete a cbt packet.  Plan: Return again in 1 weeks.  Diagnosis: Axis I: Major Depressive Disorder, recurrent, severe, Generalized Anxiety Disorder, Anorexia Nervosa.      [name] A, LCSW 05/13/2014

## 2014-05-20 ENCOUNTER — Ambulatory Visit (INDEPENDENT_AMBULATORY_CARE_PROVIDER_SITE_OTHER): Payer: PRIVATE HEALTH INSURANCE | Admitting: Clinical

## 2014-05-20 ENCOUNTER — Encounter (HOSPITAL_COMMUNITY): Payer: Self-pay | Admitting: Clinical

## 2014-05-20 DIAGNOSIS — F5000 Anorexia nervosa, unspecified: Secondary | ICD-10-CM

## 2014-05-20 DIAGNOSIS — F411 Generalized anxiety disorder: Secondary | ICD-10-CM | POA: Diagnosis not present

## 2014-05-20 DIAGNOSIS — F332 Major depressive disorder, recurrent severe without psychotic features: Secondary | ICD-10-CM | POA: Diagnosis not present

## 2014-05-20 NOTE — Progress Notes (Signed)
   THERAPIST PROGRESS NOTE  Session Time: 4:45 -5:30  Participation Level: Active  Behavioral Response: CasualAlertAnxious  Type of Therapy: Individual Therapy  Treatment Goals addressed:  improve psychiatric symptoms, Improve unhealthy thinking patterns, emotional regulation skills,  Interventions: CBT, motivational interviewing, and grounding and mindfulness techniques  Summary: Miguel [name] is a 24 y.o. male who presents with  Major Depressive Disorder, recurrent, severe, Generalized Anxiety Disorder, Anorexia Nervosa.   Suicidal/Homicidal: Nowithout intent/plan  Therapist Response: Elita Quick met with clinician for an individual session. [name] shared about his psychiatric symptoms, his current life events and his homework. [name] discussed his homework. He shared that he became really frustrated because he felt like he should already know the stuff. Client and clinician discussed the process of learning, practice, knowing, and integrating information. [name] shared about his week. He shared that he had spent a lot of time at home and felt sad. Client and clinician discussed his thoughts and emotions. [name] shared that he had done some grounding techniques but not as much as would be helpful. [name] discussed some of his behaviors and events that had happened to him. He shared his confusion about the cycle. Keaten and clincian explored some possibilities.  Plan:  Return again in 1 weeks.  Diagnosis: Axis I: Major Depressive Disorder, recurrent, severe, Generalized Anxiety Disorder, Anorexia Nervosa.       Leon Goodnow A, LCSW 05/20/2014

## 2014-05-27 ENCOUNTER — Ambulatory Visit (INDEPENDENT_AMBULATORY_CARE_PROVIDER_SITE_OTHER): Payer: PRIVATE HEALTH INSURANCE | Admitting: Clinical

## 2014-05-27 ENCOUNTER — Encounter (HOSPITAL_COMMUNITY): Payer: Self-pay | Admitting: Clinical

## 2014-05-27 DIAGNOSIS — F332 Major depressive disorder, recurrent severe without psychotic features: Secondary | ICD-10-CM | POA: Diagnosis not present

## 2014-05-27 DIAGNOSIS — F5000 Anorexia nervosa, unspecified: Secondary | ICD-10-CM

## 2014-05-27 DIAGNOSIS — F411 Generalized anxiety disorder: Secondary | ICD-10-CM

## 2014-05-27 NOTE — Progress Notes (Signed)
   THERAPIST PROGRESS NOTE  Session Time: 4:45 -5:35  Participation Level: Active  Behavioral Response: CasualAlertAnxious  Type of Therapy: Individual Therapy  Treatment Goals addressed:  improve psychiatric symptoms, Improve unhealthy thinking patterns, emotional regulation skills,  Interventions: CBT, motivational interviewing  Summary: Miguel [name] is a 24 y.o. male who presents with Major Depressive Disorder, recurrent, severe, Generalized Anxiety Disorder, Anorexia Nervosa.   Suicidal/Homicidal: Nowithout intent/plan  Therapist Response: Elita Quick met with clinician for an individual session. Khang shared about his psychiatric symptoms, his current life events and his homework. Aveer shared that he did not complete his cbt homework but has been doing grounding/ mindfulness techniques. He shared that he had fond them helpful. Jadden discussed one of the daily challenges he faces. Lamichael has very feminine mannerisms and appearance. Danil shared that this is something he likes ( to pass as a woman) but also causes him pain when he is rejected for not being a woman. Elita Quick and clinician discussed gender and identity. Boleslaus shared some of his negative beliefs about himself. Client and clinician discussed the evidence that did and did not support his negative automatic thoughts. Dvante was able to formulate healthier alternative thoughts. Client and clinician discussed the unrealistic expectation that everyone would approve of him (or anyone else). Client and clinician discussed self acceptance and self love. Client and clinician began a discussion about how to begin. Addis agreed to continue his homework until next session.   Plan:  Return again in 1 weeks.  Diagnosis: Axis I: Major Depressive Disorder, recurrent, severe, Generalized Anxiety Disorder, Anorexia Nervosa.   Meilin Brosh A, LCSW 05/27/2014

## 2014-06-03 ENCOUNTER — Encounter (HOSPITAL_COMMUNITY): Payer: Self-pay | Admitting: Clinical

## 2014-06-03 ENCOUNTER — Ambulatory Visit (INDEPENDENT_AMBULATORY_CARE_PROVIDER_SITE_OTHER): Payer: PRIVATE HEALTH INSURANCE | Admitting: Clinical

## 2014-06-03 DIAGNOSIS — F5000 Anorexia nervosa, unspecified: Secondary | ICD-10-CM | POA: Diagnosis not present

## 2014-06-03 DIAGNOSIS — F411 Generalized anxiety disorder: Secondary | ICD-10-CM | POA: Diagnosis not present

## 2014-06-03 DIAGNOSIS — F332 Major depressive disorder, recurrent severe without psychotic features: Secondary | ICD-10-CM | POA: Diagnosis not present

## 2014-06-03 NOTE — Progress Notes (Signed)
   THERAPIST PROGRESS NOTE  Session Time: 4:40 -5:30  Participation Level: Active  Behavioral Response: CasualAlertNA  Type of Therapy: Individual Therapy  Treatment Goals addressed:  improve psychiatric symptoms, Improve unhealthy thinking patterns, emotional regulation skills,  Interventions: CBT, motivational interviewing,  Summary: Jt Brabec is a 23 y.o. male who presents with Major Depressive Disorder, recurrent, severe, Generalized Anxiety Disorder, Anorexia Nervosa..   Suicidal/Homicidal: No - without intent/plan  Therapist Response: Haydin met with clinician for an individual session. Babacar shared about his psychiatric symptoms, his current life events and his homework. Canio shared that he completed his grounding and cbt homework. Dat shared his thoughts and insights for each. Dabney shared that he has a difficult time with his self image. He shared that his gender identity is confusing to others and that he is not always sure how to deal with that. Client and clinician discussed his self image and how it affects his mental health. Jarod and clinician discussed the struggle of being true to oneself while being in the process of discovery. Taiquan discussed some of his current coping methods to deal with unwanted emotions. Client and clinician discussed healthier alternative coping skills. Conley agreed to continue his homework until next session.  Plan:  Return again in 1 weeks.  Diagnosis: Axis I: Major Depressive Disorder, recurrent, severe, Generalized Anxiety Disorder, Anorexia Nervosa.    Jamell Laymon A, LCSW 06/03/2014

## 2014-06-04 ENCOUNTER — Ambulatory Visit (HOSPITAL_COMMUNITY): Payer: Self-pay | Admitting: Psychiatry

## 2014-06-09 ENCOUNTER — Ambulatory Visit (HOSPITAL_COMMUNITY): Payer: Self-pay | Admitting: Clinical

## 2014-06-10 ENCOUNTER — Ambulatory Visit (HOSPITAL_COMMUNITY): Payer: Self-pay | Admitting: Clinical

## 2014-06-17 ENCOUNTER — Ambulatory Visit (INDEPENDENT_AMBULATORY_CARE_PROVIDER_SITE_OTHER): Payer: PRIVATE HEALTH INSURANCE | Admitting: Clinical

## 2014-06-17 ENCOUNTER — Encounter (HOSPITAL_COMMUNITY): Payer: Self-pay | Admitting: Clinical

## 2014-06-17 DIAGNOSIS — F5000 Anorexia nervosa, unspecified: Secondary | ICD-10-CM | POA: Diagnosis not present

## 2014-06-17 DIAGNOSIS — F411 Generalized anxiety disorder: Secondary | ICD-10-CM | POA: Diagnosis not present

## 2014-06-17 DIAGNOSIS — F332 Major depressive disorder, recurrent severe without psychotic features: Secondary | ICD-10-CM

## 2014-06-17 NOTE — Progress Notes (Signed)
   THERAPIST PROGRESS NOTE  Session Time: 4:30 - 5:28  Participation Level: Active  Behavioral Response: CasualAlertAnxious  Type of Therapy: Individual Therapy  Treatment Goals addressed:  improve psychiatric symptoms, Improve unhealthy thinking patterns, emotional regulation skills,  Interventions: CBT, motivational interviewing, and grounding and mindfulness techniques  Summary: Miguel [name] is a 24 y.o. male who presents with Major Depressive Disorder, recurrent, severe, Generalized Anxiety Disorder, Anorexia Nervosa..   Suicidal/Homicidal: No - without intent/plan. [name] denied any current self harming behvaiors  Therapist Response: Elita Quick met with clinician for an individual session. [name] shared about his psychiatric symptoms, his current life events and his homework. [name] shared that he had a rough week. He shared that he had gained some closure on a previous relationship. He shared that while this was good it also began another level of the grieving process for the loss of this relationship. Client and clinician discussed his response to the grieving process. [name] was able to identify both healthy and unhealthy responses. [name] was able to explain in detail and to identify their outcomes. Client and clinician discussed additional possible responses. [name] and clinician discussed some of the negative evaluations [name] had about himself due to the end of the relationship. Berkley and clinician examined the evidence that did and did not support the evaluations. [name] shared that while they might not be true they "feel" true.[name] shared that he had done his grounding/mindfulness homework but had not brought his other homework. Client and clinician discussed his motivations and goals. [name] agreed to do his homework before next session.    Plan:  Return again in 1 weeks.  Diagnosis: Axis I: Major Depressive Disorder, recurrent, severe, Generalized Anxiety Disorder, Anorexia  Nervosa.    [name] A, LCSW 06/17/2014

## 2014-06-24 ENCOUNTER — Ambulatory Visit (HOSPITAL_COMMUNITY): Payer: Self-pay | Admitting: Clinical

## 2014-06-25 ENCOUNTER — Ambulatory Visit (HOSPITAL_COMMUNITY): Payer: 59 | Admitting: Psychiatry

## 2014-06-25 ENCOUNTER — Ambulatory Visit (HOSPITAL_COMMUNITY): Payer: Self-pay | Admitting: Psychiatry

## 2014-07-01 ENCOUNTER — Ambulatory Visit (HOSPITAL_COMMUNITY): Payer: Self-pay | Admitting: Clinical

## 2014-07-01 DIAGNOSIS — F1910 Other psychoactive substance abuse, uncomplicated: Secondary | ICD-10-CM

## 2014-07-01 DIAGNOSIS — G2579 Other drug induced movement disorders: Secondary | ICD-10-CM

## 2014-07-01 HISTORY — DX: Other drug induced movement disorders: G25.79

## 2014-07-01 HISTORY — DX: Other psychoactive substance abuse, uncomplicated: F19.10

## 2014-07-02 ENCOUNTER — Ambulatory Visit (INDEPENDENT_AMBULATORY_CARE_PROVIDER_SITE_OTHER): Payer: PRIVATE HEALTH INSURANCE | Admitting: Clinical

## 2014-07-02 ENCOUNTER — Encounter (HOSPITAL_COMMUNITY): Payer: Self-pay | Admitting: Clinical

## 2014-07-02 DIAGNOSIS — F5000 Anorexia nervosa, unspecified: Secondary | ICD-10-CM | POA: Diagnosis not present

## 2014-07-02 DIAGNOSIS — F411 Generalized anxiety disorder: Secondary | ICD-10-CM

## 2014-07-02 DIAGNOSIS — F332 Major depressive disorder, recurrent severe without psychotic features: Secondary | ICD-10-CM

## 2014-07-02 NOTE — Progress Notes (Signed)
   THERAPIST PROGRESS NOTE  Session Time: 4:32 -5:30   Participation Level: Active  Behavioral Response: CasualAlertNA  Type of Therapy: Individual Therapy  Treatment Goals addressed:  improve psychiatric symptoms, Improve unhealthy thinking patterns, emotional regulation skills,  Interventions: CBT, motivational interviewing,   Summary: Miguel [name] is a 24 y.o. male who presents with Major Depressive Disorder, recurrent, severe, Generalized Anxiety Disorder, Anorexia Nervosa.  Suicidal/Homicidal: No - without intent/plan  Therapist Response: Miguel [name] met with clinician for an individual session. Truth shared about his psychiatric symptoms and his current life events. Miguel [name] shared about going to a Pharmacist, hospital where a game he did the art for was featured. He shared his excitement about the positive response. He also shared about the people he met there. Miguel [name] and clinician discussed a person that he was attracted to. Client and clinician discussed his expectations for the relationship. Client and clinician discussed how sex and relationships influence his mental health. Miguel [name] shared his thoughts and insights about needing outside validation. Client and clinician discussed Miguel [name]'s ability to validate himself. Miguel [name] agreed to keeping a log of his triggers for outside validation.Miguel [name] and clinician agreed to discuss the topic further in future sessions.  Miguel [name] and clinician began a discussion on how to regulate his emotions rather than to act on them. Miguel [name] agreed to try to do so until next session.  Plan:  Return again in 1 weeks.  Diagnosis: Axis I: Major Depressive Disorder, recurrent, severe, Generalized Anxiety Disorder, And Anorexia Nervosa    Miguel [name] A, LCSW 07/02/2014

## 2014-07-30 ENCOUNTER — Ambulatory Visit (HOSPITAL_COMMUNITY): Payer: 59 | Admitting: Psychiatry

## 2014-08-04 ENCOUNTER — Ambulatory Visit (HOSPITAL_COMMUNITY): Payer: 59 | Admitting: Clinical

## 2014-08-05 ENCOUNTER — Ambulatory Visit (HOSPITAL_COMMUNITY): Payer: 59 | Admitting: Clinical

## 2014-08-12 ENCOUNTER — Ambulatory Visit (INDEPENDENT_AMBULATORY_CARE_PROVIDER_SITE_OTHER): Payer: PRIVATE HEALTH INSURANCE | Admitting: Clinical

## 2014-08-12 ENCOUNTER — Encounter (HOSPITAL_COMMUNITY): Payer: Self-pay | Admitting: Clinical

## 2014-08-12 DIAGNOSIS — F332 Major depressive disorder, recurrent severe without psychotic features: Secondary | ICD-10-CM

## 2014-08-12 DIAGNOSIS — F5000 Anorexia nervosa, unspecified: Secondary | ICD-10-CM

## 2014-08-12 DIAGNOSIS — F411 Generalized anxiety disorder: Secondary | ICD-10-CM

## 2014-08-12 NOTE — Progress Notes (Signed)
   THERAPIST PROGRESS NOTE  Session Time: 4:30 - 5:35  Participation Level: Active  Behavioral Response: CasualAlertDepressed    Type of Therapy: Individual Therapy  Treatment Goals addressed:  improve psychiatric symptoms, Improve unhealthy thinking patterns, emotional regulation skills,  Interventions: CBT, motivational interviewing, and grounding and mindfulness techniques  Summary: Miguel [name] is a 24 y.o. male who presents with Major Depressive Disorder, recurrent, severe, Generalized Anxiety Disorder, Anorexia Nervosa..   Suicidal/Homicidal: No - without intent/plan - Miguel [name] did share that he had done some self harming behaviors. He reported that he had cut on his arm, but denied he was trying to commit suicide. He reported that he had stopped and then shared with friends who supported them. He denied any medical attention reporting that it wasn't "that bad." Miguel [name] denied any current suicidal or homicidal ideation. He shared that his circumstances had changed and he was wanting to live. Clinician asked if Miguel [name] would like to go to receive a higher level of care. He said no. Client and clinician reviewed what he could do if he was feeling like self harming again and clinician gave him a number with emergency contact numbers.   Therapist Response: Miguel [name] met with clinician for an individual session. Miguel [name] shared about his psychiatric symptoms  and his current life events. Miguel [name] shared that he had been feeling very depressed and  that he had done some self harming behaviors. He reported that he had cut on his arm, but denied he was trying to commit suicide. He reported that he had stopped and then shared with friends who supported them. He denied any medical attention reporting that it wasn't "that bad." Miguel [name] denied any current suicidal or homicidal ideation. He shared that his circumstances had changed and he was wanting to live. Clinician asked if Miguel [name] would like to go to receive a higher level of  care. He said no. Client and clinician reviewed what he could do if he was feeling like self harming again and clinician gave him a number with emergency contact numbers. Miguel [name] discussed the circumstances that led to his behavior.  Miguel [name] shared that he will be moving and that his apartment had become to expensive for him. He shared that he had been feeling bad about the break up of his last significant relationship. He shared that his emotions changed when friends had invited him to go live with them in [place]. He shared he felt really happy and excited about this and planned to do so. Miguel [name] and clinician discussed how our emotions can change with our thoughts of what is possible change. Miguel [name] and clinician practiced a grounding technique together.     Plan:  Return again in 1 weeks.  Diagnosis: Axis I: Major Depressive Disorder, recurrent, severe, Generalized Anxiety Disorder, And Anorexia Nervosa.    Allyssa Abruzzese A, LCSW 08/12/2014

## 2014-08-13 ENCOUNTER — Ambulatory Visit (HOSPITAL_COMMUNITY): Payer: 59 | Admitting: Clinical

## 2014-08-19 ENCOUNTER — Ambulatory Visit (INDEPENDENT_AMBULATORY_CARE_PROVIDER_SITE_OTHER): Payer: PRIVATE HEALTH INSURANCE | Admitting: Clinical

## 2014-08-19 DIAGNOSIS — F411 Generalized anxiety disorder: Secondary | ICD-10-CM

## 2014-08-19 DIAGNOSIS — F332 Major depressive disorder, recurrent severe without psychotic features: Secondary | ICD-10-CM | POA: Diagnosis not present

## 2014-08-19 DIAGNOSIS — F5000 Anorexia nervosa, unspecified: Secondary | ICD-10-CM

## 2014-08-20 NOTE — Progress Notes (Signed)
   THERAPIST PROGRESS NOTE  Session Time: 4:32 -5:30  Participation Level: Active  Behavioral Response: CasualAlertAnxious   Type of Therapy: Individual Therapy  Treatment Goals addressed:  improve psychiatric symptoms, Improve unhealthy thinking patterns, emotional regulation skills,  Interventions: CBT, motivational interviewing, and grounding and mindfulness techniques  Summary: Miguel [name] is [name] 24 y.o. male who presents with Major Depressive Disorder, recurrent, severe, Generalized Anxiety Disorder, Anorexia Nervosa..   Suicidal/Homicidal: No - without intent/plan  Therapist Response: Miguel [name] met with clinician for an individual session. Miguel [name] shared about his psychiatric symptoms and his current life events. Miguel [name] shared that he was feeling happy because he found [name] place to stay in [place] and the he [name] [name] new friend. Miguel [name] and clinician discussed how our emotions change. He reflected on how he was feeling when he [name] hurt himself vs now. Miguel [name] stated that when he is upset it feels like that is all there is. Miguel [name] and clinician discussed some strategies to help remind him that his feelings are temporary when he is feeling bad. Miguel [name] agreed to try some of the techniques. Miguel [name] and clinician discussed how he could use art as [name] grounding technique instead of self harm. Clinician demonstrated one and Miguel [name] tried it. Miguel [name] shared that he was feeling guilt about being ready to put away some of the objects from his last significant relationship. Miguel [name] and clinician discussed his guilt - the evidence for and against the guilt. Miguel [name] the insight that it was okay to move on and still have love for the person he is no longer with. Miguel [name] and clinician discussed how Miguel [name]'s self esteem in tied into his relationships.Miguel [name] and clinician agreed to explore this topic futher at [name] future session.  Plan:  Return again in 1 weeks. eDiagnosis: Axis I: Major Depressive Disorder, recurrent, severe, Generalized Anxiety  Disorder, And Anorexia Nervosa.    Miguel Stmartin A, LCSW 08/20/2014

## 2014-08-22 ENCOUNTER — Encounter (HOSPITAL_COMMUNITY): Payer: Self-pay | Admitting: Clinical

## 2014-08-26 ENCOUNTER — Ambulatory Visit (HOSPITAL_COMMUNITY): Payer: 59 | Admitting: Clinical

## 2014-08-27 ENCOUNTER — Ambulatory Visit (HOSPITAL_COMMUNITY): Payer: 59 | Admitting: Clinical

## 2014-09-02 ENCOUNTER — Ambulatory Visit (INDEPENDENT_AMBULATORY_CARE_PROVIDER_SITE_OTHER): Payer: PRIVATE HEALTH INSURANCE | Admitting: Clinical

## 2014-09-02 ENCOUNTER — Encounter (HOSPITAL_COMMUNITY): Payer: Self-pay | Admitting: Clinical

## 2014-09-02 DIAGNOSIS — F411 Generalized anxiety disorder: Secondary | ICD-10-CM

## 2014-09-02 DIAGNOSIS — F5000 Anorexia nervosa, unspecified: Secondary | ICD-10-CM

## 2014-09-02 DIAGNOSIS — F332 Major depressive disorder, recurrent severe without psychotic features: Secondary | ICD-10-CM | POA: Diagnosis not present

## 2014-09-02 NOTE — Progress Notes (Addendum)
   THERAPIST PROGRESS NOTE  Session Time: 4:08 - 5:11  Participation Level: Active  Behavioral Response: CasualAlertAnxious  Type of Therapy: Individual Therapy  Treatment Goals addressed:  improve psychiatric symptoms, Improve unhealthy thinking patterns, emotional regulation skills,  Interventions: CBT, motivational interviewing,   Summary: Miguel [name] is a 24 y.o. male who presents with Major Depressive Disorder, recurrent, severe, Generalized Anxiety Disorder, Anorexia Nervosa..   Suicidal/Homicidal: No - without intent/plan  Therapist Response: Miguel [name] met with clinician for an individual session. Ac shared about his psychiatric symptoms, his current life events and his homework. Miguel [name] shared that he had not completed his homework. Miguel [name] shared that he recognized an unhealthy pattern of sexual behavior beginning to emerge. He shared that he recognized this pattern from the past. He shared that it usually starts out happy and exciting but ends poorly with self harm. He shared that the last time he experienced it, it had lasted about a month and a half. Client and clinician discussed whether or not he was experiencing other symptoms and he reported he was feeling euphoric, and was not taking care of his priorities, and was not sleeping as much. Clinician suspects that Miguel [name] is experiencing mania. Miguel [name] shared he has an appointment set to meet with a psychiatrist about medication. Clinician encouraged Miguel [name] to discuss these symptoms with the psychiatrist. Client and clinician discussed the possibility of this being Mania. Client and clinician discussed steps Miguel [name] could take to reduce the risks and problems associated with his behaviors or to stop the behavior. Miguel [name] outlined some strategies that have helped in the past. Clinician suggested additional things such as getting 8 hours of sleep, eating right, spending time with only close friends, and sharing with his partner his concerns. Clinician will  better evaluate Miguel [name] for Bipolar at future session. The additional symptoms were woven through out the conversation and clinician's most immediate concern was Miguel [name]'s safety. Miguel [name] reported that he still has the emergency numbers and is aware of what to do if he needs help.  Rule out for Bipolar.   Plan:  Return again in 1 weeks.  Diagnosis: Axis I: Major Depressive Disorder, recurrent, severe, Generalized Anxiety Disorder, And Anorexia Nervosa.   Korinna Tat A, LCSW 09/02/2014

## 2014-09-09 ENCOUNTER — Encounter (HOSPITAL_COMMUNITY): Payer: Self-pay | Admitting: Clinical

## 2014-09-09 ENCOUNTER — Ambulatory Visit (INDEPENDENT_AMBULATORY_CARE_PROVIDER_SITE_OTHER): Payer: PRIVATE HEALTH INSURANCE | Admitting: Clinical

## 2014-09-09 DIAGNOSIS — F332 Major depressive disorder, recurrent severe without psychotic features: Secondary | ICD-10-CM | POA: Diagnosis not present

## 2014-09-09 DIAGNOSIS — F5000 Anorexia nervosa, unspecified: Secondary | ICD-10-CM | POA: Diagnosis not present

## 2014-09-09 DIAGNOSIS — F411 Generalized anxiety disorder: Secondary | ICD-10-CM | POA: Diagnosis not present

## 2014-09-09 NOTE — Progress Notes (Signed)
   THERAPIST PROGRESS NOTE  Session Time: 3:50 - 4:52  Participation Level: Active  Behavioral Response: CasualAlertAnxious and Depressed  Type of Therapy: Individual Therapy  Treatment Goals addressed:  improve psychiatric symptoms, Improve unhealthy thinking patterns, emotional regulation skills,  Interventions: CBT, motivational interviewing, and grounding and mindfulness techniques  Summary: Miguel [name] is a 24 y.o. male who presents with Major Depressive Disorder, recurrent, severe, Generalized Anxiety Disorder, Anorexia Nervosa..   Suicidal/Homicidal: No - without intent/plan  Therapist Response: [name] met with clinician for an individual session. [name] shared about his psychiatric symptoms, his current life events and his homework. [name] reported that he did not do his homework yet. [name] shared that he was feeling more "blah" than at last session.  [name] shared that he made an agreement with his partner that put an end to his unhealthy sexual behavior. [name] shared that he both felt relieved and boxed in. Client and clinician discussed his thoughts about the situation. [name] shared some of his negative automatic thoughts about the situation. [name] and clinician discussed how he actually had  The power of choice. [name] was free to choose rather than boxed in. With each choice there are consequences. He was free to choose what would make him most happy. [name] shared that he thought he might feel more emotionally stable if he were to choose to honor the agreement made with his partner. He shared that he could recognize that his thoughts made a big difference in his behaviors. [name] agreed to try a behavioral (cbt) experiment to test his beliefs. [name] shared he felt better knowing he had the power of choice       Plan:  Return again in 1 weeks.  Diagnosis: Axis I: Major Depressive Disorder, recurrent, severe, Generalized Anxiety Disorder, And Anorexia Nervosa.  Rule out for bipolar  disordr    [name], LCSW 09/09/2014

## 2014-09-16 ENCOUNTER — Ambulatory Visit (HOSPITAL_COMMUNITY): Payer: 59 | Admitting: Clinical

## 2014-12-05 ENCOUNTER — Encounter (HOSPITAL_COMMUNITY): Payer: Self-pay | Admitting: *Deleted

## 2014-12-05 ENCOUNTER — Inpatient Hospital Stay (HOSPITAL_COMMUNITY)
Admission: EM | Admit: 2014-12-05 | Discharge: 2014-12-20 | DRG: 056 | Disposition: A | Discharge status: Home / Self Care | Payer: 59 | Attending: Internal Medicine | Admitting: Internal Medicine

## 2014-12-05 ENCOUNTER — Emergency Department (HOSPITAL_COMMUNITY): Payer: 59

## 2014-12-05 DIAGNOSIS — G2579 Other drug induced movement disorders: Secondary | ICD-10-CM | POA: Diagnosis not present

## 2014-12-05 DIAGNOSIS — T5491XA Toxic effect of unspecified corrosive substance, accidental (unintentional), initial encounter: Secondary | ICD-10-CM

## 2014-12-05 DIAGNOSIS — E739 Lactose intolerance, unspecified: Secondary | ICD-10-CM | POA: Diagnosis present

## 2014-12-05 DIAGNOSIS — J029 Acute pharyngitis, unspecified: Secondary | ICD-10-CM | POA: Diagnosis present

## 2014-12-05 DIAGNOSIS — E876 Hypokalemia: Secondary | ICD-10-CM | POA: Diagnosis present

## 2014-12-05 DIAGNOSIS — T622X4A Toxic effect of other ingested (parts of) plant(s), undetermined, initial encounter: Secondary | ICD-10-CM

## 2014-12-05 DIAGNOSIS — R7302 Impaired glucose tolerance (oral): Secondary | ICD-10-CM | POA: Diagnosis present

## 2014-12-05 DIAGNOSIS — R1310 Dysphagia, unspecified: Secondary | ICD-10-CM | POA: Insufficient documentation

## 2014-12-05 DIAGNOSIS — K222 Esophageal obstruction: Secondary | ICD-10-CM

## 2014-12-05 DIAGNOSIS — G9341 Metabolic encephalopathy: Secondary | ICD-10-CM | POA: Diagnosis present

## 2014-12-05 DIAGNOSIS — Z79899 Other long term (current) drug therapy: Secondary | ICD-10-CM

## 2014-12-05 DIAGNOSIS — F339 Major depressive disorder, recurrent, unspecified: Secondary | ICD-10-CM | POA: Diagnosis present

## 2014-12-05 DIAGNOSIS — R6510 Systemic inflammatory response syndrome (SIRS) of non-infectious origin without acute organ dysfunction: Secondary | ICD-10-CM | POA: Diagnosis present

## 2014-12-05 DIAGNOSIS — F640 Transsexualism: Secondary | ICD-10-CM | POA: Diagnosis present

## 2014-12-05 DIAGNOSIS — R002 Palpitations: Secondary | ICD-10-CM | POA: Diagnosis present

## 2014-12-05 DIAGNOSIS — R509 Fever, unspecified: Secondary | ICD-10-CM | POA: Diagnosis present

## 2014-12-05 DIAGNOSIS — R4182 Altered mental status, unspecified: Secondary | ICD-10-CM

## 2014-12-05 DIAGNOSIS — R000 Tachycardia, unspecified: Secondary | ICD-10-CM | POA: Diagnosis present

## 2014-12-05 DIAGNOSIS — R112 Nausea with vomiting, unspecified: Secondary | ICD-10-CM | POA: Diagnosis not present

## 2014-12-05 DIAGNOSIS — T43225A Adverse effect of selective serotonin reuptake inhibitors, initial encounter: Secondary | ICD-10-CM | POA: Diagnosis present

## 2014-12-05 DIAGNOSIS — Z01818 Encounter for other preprocedural examination: Secondary | ICD-10-CM

## 2014-12-05 DIAGNOSIS — K2210 Ulcer of esophagus without bleeding: Secondary | ICD-10-CM | POA: Diagnosis present

## 2014-12-05 DIAGNOSIS — R9431 Abnormal electrocardiogram [ECG] [EKG]: Secondary | ICD-10-CM | POA: Diagnosis present

## 2014-12-05 DIAGNOSIS — J9620 Acute and chronic respiratory failure, unspecified whether with hypoxia or hypercapnia: Secondary | ICD-10-CM | POA: Diagnosis present

## 2014-12-05 HISTORY — DX: Other psychoactive substance abuse, uncomplicated: F19.10

## 2014-12-05 HISTORY — DX: Other specified health status: Z78.9

## 2014-12-05 HISTORY — DX: Other drug induced movement disorders: G25.79

## 2014-12-05 HISTORY — DX: Transsexualism: F64.0

## 2014-12-05 LAB — CBC WITH DIFFERENTIAL/PLATELET
Basophils Absolute: 0.1 10*3/uL (ref 0.0–0.1)
Basophils Relative: 1 %
EOS PCT: 0 %
Eosinophils Absolute: 0.1 10*3/uL (ref 0.0–0.7)
HEMATOCRIT: 38.6 % — AB (ref 39.0–52.0)
Hemoglobin: 12.9 g/dL — ABNORMAL LOW (ref 13.0–17.0)
Lymphocytes Relative: 8 %
Lymphs Abs: 1.4 10*3/uL (ref 0.7–4.0)
MCH: 29.9 pg (ref 26.0–34.0)
MCHC: 33.4 g/dL (ref 30.0–36.0)
MCV: 89.6 fL (ref 78.0–100.0)
MONO ABS: 0.6 10*3/uL (ref 0.1–1.0)
Monocytes Relative: 3 %
Neutro Abs: 15.7 10*3/uL — ABNORMAL HIGH (ref 1.7–7.7)
Neutrophils Relative %: 88 %
Platelets: 279 10*3/uL (ref 150–400)
RBC: 4.31 MIL/uL (ref 4.22–5.81)
RDW: 12.5 % (ref 11.5–15.5)
WBC: 17.8 10*3/uL — ABNORMAL HIGH (ref 4.0–10.5)

## 2014-12-05 LAB — COMPREHENSIVE METABOLIC PANEL
ALT: 20 U/L (ref 17–63)
ANION GAP: 14 (ref 5–15)
AST: 26 U/L (ref 15–41)
Albumin: 3.8 g/dL (ref 3.5–5.0)
Alkaline Phosphatase: 73 U/L (ref 38–126)
BILIRUBIN TOTAL: 0.4 mg/dL (ref 0.3–1.2)
BUN: 16 mg/dL (ref 6–20)
CALCIUM: 9 mg/dL (ref 8.9–10.3)
CO2: 24 mmol/L (ref 22–32)
Chloride: 100 mmol/L — ABNORMAL LOW (ref 101–111)
Creatinine, Ser: 0.89 mg/dL (ref 0.61–1.24)
GFR calc Af Amer: >60 mL/min (ref >60)
Glucose, Bld: 189 mg/dL — ABNORMAL HIGH (ref 65–99)
POTASSIUM: 2.9 mmol/L — AB (ref 3.5–5.1)
Sodium: 138 mmol/L (ref 135–145)
TOTAL PROTEIN: 6.8 g/dL (ref 6.5–8.1)

## 2014-12-05 LAB — RAPID URINE DRUG SCREEN, HOSP PERFORMED
AMPHETAMINES: NOT DETECTED
BARBITURATES: NOT DETECTED
BENZODIAZEPINES: POSITIVE — AB
COCAINE: NOT DETECTED
Opiates: NOT DETECTED
TETRAHYDROCANNABINOL: POSITIVE — AB

## 2014-12-05 LAB — ACETAMINOPHEN LEVEL: Acetaminophen (Tylenol), Serum: <10 ug/mL — ABNORMAL LOW (ref 10–30)

## 2014-12-05 LAB — SALICYLATE LEVEL

## 2014-12-05 MED ORDER — FAMOTIDINE IN NACL 20-0.9 MG/50ML-% IV SOLN
20.0000 mg | Freq: Once | INTRAVENOUS | Status: AC
  Administered 2014-12-05: 20 mg via INTRAVENOUS
  Filled 2014-12-05: qty 50

## 2014-12-05 MED ORDER — SODIUM CHLORIDE 0.9 % IV BOLUS (SEPSIS)
1000.0000 mL | Freq: Once | INTRAVENOUS | Status: AC
  Administered 2014-12-05: 1000 mL via INTRAVENOUS

## 2014-12-05 MED ORDER — LIDOCAINE VISCOUS 2 % MT SOLN
15.0000 mL | Freq: Once | OROMUCOSAL | Status: AC
Start: 2014-12-05 — End: 2014-12-06
  Administered 2014-12-06: 15 mL via OROMUCOSAL
  Filled 2014-12-05: qty 15

## 2014-12-05 MED ORDER — ACETAMINOPHEN 325 MG PO TABS
650.0000 mg | ORAL_TABLET | Freq: Once | ORAL | Status: AC
  Administered 2014-12-05: 650 mg via ORAL
  Filled 2014-12-05: qty 2

## 2014-12-05 MED ORDER — LORAZEPAM 2 MG/ML IJ SOLN
1.0000 mg | Freq: Once | INTRAMUSCULAR | Status: AC
  Administered 2014-12-05: 1 mg via INTRAVENOUS
  Filled 2014-12-05: qty 1

## 2014-12-05 MED ORDER — ONDANSETRON HCL 4 MG/2ML IJ SOLN
4.0000 mg | Freq: Once | INTRAMUSCULAR | Status: AC
  Administered 2014-12-05: 4 mg via INTRAVENOUS
  Filled 2014-12-05: qty 2

## 2014-12-05 MED ORDER — POTASSIUM CHLORIDE 10 MEQ/100ML IV SOLN
10.0000 meq | Freq: Once | INTRAVENOUS | Status: AC
Start: 2014-12-05 — End: 2014-12-06
  Administered 2014-12-05: 10 meq via INTRAVENOUS
  Filled 2014-12-05: qty 100

## 2014-12-05 NOTE — ED Provider Notes (Signed)
Patient seen/examined in the Emergency Department in conjunction with Resident Physician Provider  Patient reports she ingested home brewed hallucinogenic tea.   Exam : awake/alert, appears anxious.  She is tachycardic Plan: monitor in ED.    Zadie Rhineonald Arin Peral, MD 12/05/14 1929

## 2014-12-05 NOTE — ED Provider Notes (Signed)
CSN: [id number]     Arrival date & time 12/05/14  1805 History   First MD Initiated Contact with Patient 12/05/14 1823     Chief Complaint  Patient presents with  . Ingestion     HISTORY, ROS, AND PHYSICAL EXAM LIMITED BY CRITICAL CONDITION OF PATIENT AND MENTAL STATUS CHANGES   (Consider location/radiation/quality/duration/timing/severity/associated sxs/prior Treatment) Patient is a 24 y.o. male presenting with Ingested Medication. The history is provided by a friend. The history is limited by the condition of the patient.  Ingestion This is a new problem. The current episode started today. The problem occurs rarely. The problem has been unchanged. Associated symptoms include nausea, a sore throat and vomiting. Nothing aggravates the symptoms. He has tried nothing for the symptoms. The treatment provided no relief.    History reviewed. No pertinent past medical history. History reviewed. No pertinent past surgical history. No family history on file. Social History  Substance Use Topics  . Smoking status: None  . Smokeless tobacco: None  . Alcohol Use: None    Review of Systems  Unable to perform ROS: Mental status change  HENT: Positive for sore throat.   Respiratory: Positive for shortness of breath.   Gastrointestinal: Positive for nausea and vomiting.  Neurological: Positive for speech difficulty.  Psychiatric/Behavioral: The patient is nervous/anxious.       Allergies  Review of patient's allergies indicates no known allergies.  Home Medications   Prior to Admission medications   Medication Sig Start Date End Date Taking? Authorizing Provider  estradiol (ESTRACE) 1 MG tablet Take 2.5 mg by mouth 2 (two) times daily. 10/27/14  Yes Historical Provider, MD  sertraline (ZOLOFT) 100 MG tablet Take 100 mg by mouth daily. 11/22/14  Yes Historical Provider, MD  spironolactone (ALDACTONE) 100 MG tablet Take 100 mg by mouth 2 (two) times daily. 11/22/14  Yes Historical  Provider, MD   BP 102/72 mmHg  Pulse 115  Temp(Src) 99.1 F (37.3 C) (Oral)  Resp 18  SpO2 98% Physical Exam  Constitutional: He appears well-developed and well-nourished. He appears listless. He appears distressed.  HENT:  Head: Normocephalic and atraumatic.  Right Ear: External ear normal.  Left Ear: External ear normal.  Nose: Nose normal.  Mouth/Throat: Oropharynx is clear and moist. No oropharyngeal exudate.  Eyes: Conjunctivae and EOM are normal. Pupils are equal, round, and reactive to light. Right eye exhibits no discharge. Left eye exhibits no discharge. No scleral icterus.  Neck: Normal range of motion. Neck supple. No JVD present. No tracheal deviation present. No thyromegaly present.  Cardiovascular: Regular rhythm and intact distal pulses.  Tachycardia present.   Pulmonary/Chest: Effort normal and breath sounds normal. No stridor. No respiratory distress. He has no wheezes. He has no rales. He exhibits no tenderness.  Abdominal: Soft. He exhibits no distension. There is no tenderness.  Musculoskeletal: Normal range of motion. He exhibits no edema or tenderness.  Lymphadenopathy:    He has no cervical adenopathy.  Neurological: He appears listless. He displays tremor and abnormal reflex. He displays no atrophy. He exhibits normal muscle tone. He displays no seizure activity. GCS eye subscore is 4. GCS verbal subscore is 5. GCS motor subscore is 6.  Reflex Scores:      Achilles reflexes are 4+ on the right side and 4+ on the left side. Pt tremulous, found to have clonus in ankles on repeat exam.  Stuttering dysarthria, but appears oriented.  No muscle rigidity.  Skin: Skin is warm and dry. No rash  noted. He is not diaphoretic. No erythema. No pallor.  Psychiatric: He has a normal mood and affect. His behavior is normal. Judgment and thought content normal.  Nursing note and vitals reviewed.   ED Course  Procedures (including critical care time) Labs Review Labs Reviewed   URINE RAPID DRUG SCREEN, HOSP PERFORMED - Abnormal; Notable for the following:    Benzodiazepines POSITIVE (*)    Tetrahydrocannabinol POSITIVE (*)    All other components within normal limits  CBC WITH DIFFERENTIAL/PLATELET - Abnormal; Notable for the following:    WBC 17.8 (*)    Hemoglobin 12.9 (*)    HCT 38.6 (*)    Neutro Abs 15.7 (*)    All other components within normal limits  COMPREHENSIVE METABOLIC PANEL - Abnormal; Notable for the following:    Potassium 2.9 (*)    Chloride 100 (*)    Glucose, Bld 189 (*)    All other components within normal limits  ACETAMINOPHEN LEVEL - Abnormal; Notable for the following:    Acetaminophen (Tylenol), Serum <10 (*)    All other components within normal limits  SALICYLATE LEVEL  MAGNESIUM  CK    Imaging Review Dg Chest Portable 1 View  12/05/2014  CLINICAL DATA:  Drug overdose. EXAM: PORTABLE CHEST 1 VIEW COMPARISON:  None. FINDINGS: The heart size and mediastinal contours are within normal limits. Both lungs are clear. The visualized skeletal structures are unremarkable. IMPRESSION: Normal chest x-ray. Electronically Signed   By: Rudie MeyerP.  Gallerani M.D.   On: 12/05/2014 21:30   I have personally reviewed and evaluated these images and lab results as part of my medical decision-making.   EKG Interpretation   Date/Time:  Saturday December 05 2014 23:01:05 EDT Ventricular Rate:  131 PR Interval:  122 QRS Duration: 86 QT Interval:  312 QTC Calculation: 461 R Axis:   75 Text Interpretation:  Sinus tachycardia Non-specific ST-t changes artifact  noted Abnormal ekg Confirmed by Bebe ShaggyWICKLINE  MD, Dorinda HillNALD ([id number]) on 12/05/2014  11:12:18 PM      MDM   Final diagnoses:  Toxic effect of other ingested (parts of) plant(s), undetermined, initial encounter  Altered mental status, unspecified altered mental status type  On selective serotonin reuptake inhibitor (SSRI) therapy   Pt reportedly drank approximately 4oz of Ayahu[place]a tea at a friends  house. For charting purposes, patient is genetically male, but self identifies as a male. Information is relayed through a friend as patient has AMS and stuttering speech.   Pt endorsing throat pain.  Mild erythema to oropharynx.  Pt was able to say a few words with difficulty and was protecting airway.  Poison control was contacted and most likely symptoms are N/V, expected to resolve in approximately 2 hours.  Also a risk of seizures, but no prophylactic medications were indicated, only observation.  Ingestion labs obtained.  Pt given ativan x 2 for tachycardia, and HTN.  MAO-I effects suspected from likely components of tea mixture.  Pt given NS bolus x2.    Labs reviewed.  Mild hypokalemia.  Leukocytosis, non-specific.  Salicylate and acetaminophen levels are negative.  HCO3 WNL, no acidosis suspected.  ECG shows prolonged QTc.  Magnesium level ordered.  CK ordered.  KCL IV ordered.  Pt still tachycardic at rest.  Repeat VS shows temp of 100.3 oral and 101 rectal.  Repeat exam shows B/L ankle clonus.  On further interview, pt is on Zoloft at home.  Suspect serotonin syndrome from co-ingestion of plant with MAO-I activity.    Pt  discussed with Dr. Lovell Sheehan for admission to step down unit.  VSS.  Pt mental status is stable.    Patient care was discussed with my attending, Dr. Bebe Shaggy.      Gavin Pound, MD 12/06/14 0120  Gavin Pound, MD 12/06/14 1610  Zadie Rhine, MD 12/07/14 [phone]

## 2014-12-05 NOTE — ED Notes (Signed)
Pt ingested [place] hallucinogenic tea [place] (approx 2 shot glasses full).  Pt unable to speak, but able to follow commands.  Pupils dilated.

## 2014-12-05 NOTE — ED Notes (Signed)
Poison Control called and stated to watch for seizures, if seizures tx with ativan and to hydrate pt.

## 2014-12-06 DIAGNOSIS — F640 Transsexualism: Secondary | ICD-10-CM | POA: Diagnosis present

## 2014-12-06 DIAGNOSIS — F339 Major depressive disorder, recurrent, unspecified: Secondary | ICD-10-CM | POA: Diagnosis present

## 2014-12-06 DIAGNOSIS — G9341 Metabolic encephalopathy: Secondary | ICD-10-CM | POA: Diagnosis present

## 2014-12-06 DIAGNOSIS — R9431 Abnormal electrocardiogram [ECG] [EKG]: Secondary | ICD-10-CM | POA: Diagnosis present

## 2014-12-06 DIAGNOSIS — Z79899 Other long term (current) drug therapy: Secondary | ICD-10-CM | POA: Diagnosis not present

## 2014-12-06 DIAGNOSIS — J029 Acute pharyngitis, unspecified: Secondary | ICD-10-CM | POA: Diagnosis present

## 2014-12-06 DIAGNOSIS — F332 Major depressive disorder, recurrent severe without psychotic features: Secondary | ICD-10-CM

## 2014-12-06 DIAGNOSIS — R002 Palpitations: Secondary | ICD-10-CM | POA: Diagnosis present

## 2014-12-06 DIAGNOSIS — G2579 Other drug induced movement disorders: Secondary | ICD-10-CM | POA: Diagnosis present

## 2014-12-06 DIAGNOSIS — T622X4A Toxic effect of other ingested (parts of) plant(s), undetermined, initial encounter: Secondary | ICD-10-CM | POA: Diagnosis present

## 2014-12-06 DIAGNOSIS — R000 Tachycardia, unspecified: Secondary | ICD-10-CM | POA: Diagnosis present

## 2014-12-06 DIAGNOSIS — T43225A Adverse effect of selective serotonin reuptake inhibitors, initial encounter: Secondary | ICD-10-CM | POA: Diagnosis present

## 2014-12-06 DIAGNOSIS — R112 Nausea with vomiting, unspecified: Secondary | ICD-10-CM | POA: Diagnosis present

## 2014-12-06 DIAGNOSIS — E739 Lactose intolerance, unspecified: Secondary | ICD-10-CM | POA: Diagnosis present

## 2014-12-06 DIAGNOSIS — R6510 Systemic inflammatory response syndrome (SIRS) of non-infectious origin without acute organ dysfunction: Secondary | ICD-10-CM | POA: Diagnosis present

## 2014-12-06 DIAGNOSIS — R509 Fever, unspecified: Secondary | ICD-10-CM | POA: Diagnosis present

## 2014-12-06 DIAGNOSIS — R1310 Dysphagia, unspecified: Secondary | ICD-10-CM | POA: Diagnosis present

## 2014-12-06 DIAGNOSIS — J9620 Acute and chronic respiratory failure, unspecified whether with hypoxia or hypercapnia: Secondary | ICD-10-CM | POA: Diagnosis present

## 2014-12-06 DIAGNOSIS — K2210 Ulcer of esophagus without bleeding: Secondary | ICD-10-CM | POA: Diagnosis present

## 2014-12-06 DIAGNOSIS — E876 Hypokalemia: Secondary | ICD-10-CM | POA: Diagnosis present

## 2014-12-06 DIAGNOSIS — R7302 Impaired glucose tolerance (oral): Secondary | ICD-10-CM | POA: Diagnosis present

## 2014-12-06 LAB — CBC
HEMATOCRIT: 34.7 % — AB (ref 39.0–52.0)
Hemoglobin: 11.4 g/dL — ABNORMAL LOW (ref 13.0–17.0)
MCH: 29.6 pg (ref 26.0–34.0)
MCHC: 32.9 g/dL (ref 30.0–36.0)
MCV: 90.1 fL (ref 78.0–100.0)
PLATELETS: 256 10*3/uL (ref 150–400)
RBC: 3.85 MIL/uL — ABNORMAL LOW (ref 4.22–5.81)
RDW: 12.9 % (ref 11.5–15.5)
WBC: 16.7 10*3/uL — ABNORMAL HIGH (ref 4.0–10.5)

## 2014-12-06 LAB — MRSA PCR SCREENING: MRSA BY PCR: NEGATIVE

## 2014-12-06 LAB — BASIC METABOLIC PANEL
Anion gap: 9 (ref 5–15)
BUN: 10 mg/dL (ref 6–20)
CALCIUM: 8.9 mg/dL (ref 8.9–10.3)
CO2: 25 mmol/L (ref 22–32)
CREATININE: 0.78 mg/dL (ref 0.61–1.24)
Chloride: 98 mmol/L — ABNORMAL LOW (ref 101–111)
GFR calc Af Amer: >60 mL/min (ref >60)
GLUCOSE: 167 mg/dL — AB (ref 65–99)
POTASSIUM: 4.2 mmol/L (ref 3.5–5.1)
Sodium: 132 mmol/L — ABNORMAL LOW (ref 135–145)

## 2014-12-06 LAB — MAGNESIUM: MAGNESIUM: 1.3 mg/dL — AB (ref 1.7–2.4)

## 2014-12-06 LAB — CK: Total CK: 58 U/L (ref 49–397)

## 2014-12-06 LAB — TSH: TSH: 0.633 u[IU]/mL (ref 0.350–4.500)

## 2014-12-06 MED ORDER — ONDANSETRON HCL 4 MG/2ML IJ SOLN
4.0000 mg | Freq: Four times a day (QID) | INTRAMUSCULAR | Status: DC | PRN
  Administered 2014-12-18 – 2014-12-20 (×4): 4 mg via INTRAVENOUS
  Filled 2014-12-06 (×5): qty 2

## 2014-12-06 MED ORDER — SUCRALFATE 1 GM/10ML PO SUSP
1.0000 g | Freq: Three times a day (TID) | ORAL | Status: DC
  Administered 2014-12-06 – 2014-12-10 (×16): 1 g via ORAL
  Filled 2014-12-06 (×20): qty 10

## 2014-12-06 MED ORDER — ACETAMINOPHEN 650 MG RE SUPP: 650.0000 mg | Freq: Four times a day (QID) | RECTAL | Status: DC | PRN

## 2014-12-06 MED ORDER — POTASSIUM CHLORIDE 10 MEQ/100ML IV SOLN
10.0000 meq | INTRAVENOUS | Status: AC
  Administered 2014-12-06 (×3): 10 meq via INTRAVENOUS
  Filled 2014-12-06 (×3): qty 100

## 2014-12-06 MED ORDER — ENOXAPARIN SODIUM 40 MG/0.4ML ~~LOC~~ SOLN
40.0000 mg | SUBCUTANEOUS | Status: DC
  Administered 2014-12-06 – 2014-12-09 (×4): 40 mg via SUBCUTANEOUS
  Filled 2014-12-06 (×5): qty 0.4

## 2014-12-06 MED ORDER — ONDANSETRON HCL 4 MG PO TABS: 4.0000 mg | ORAL_TABLET | Freq: Four times a day (QID) | ORAL | Status: DC | PRN

## 2014-12-06 MED ORDER — PANTOPRAZOLE SODIUM 40 MG IV SOLR
40.0000 mg | INTRAVENOUS | Status: DC
  Administered 2014-12-06 – 2014-12-08 (×3): 40 mg via INTRAVENOUS
  Filled 2014-12-06 (×3): qty 40

## 2014-12-06 MED ORDER — CYPROHEPTADINE HCL 4 MG PO TABS
8.0000 mg | ORAL_TABLET | Freq: Once | ORAL | Status: DC
  Filled 2014-12-06: qty 2

## 2014-12-06 MED ORDER — HYDROMORPHONE HCL 1 MG/ML IJ SOLN: 0.5000 mg | INTRAMUSCULAR | Status: DC | PRN

## 2014-12-06 MED ORDER — OXYCODONE HCL 5 MG PO TABS
5.0000 mg | ORAL_TABLET | ORAL | Status: DC | PRN
  Administered 2014-12-07 – 2014-12-10 (×15): 5 mg via ORAL
  Filled 2014-12-06 (×16): qty 1

## 2014-12-06 MED ORDER — SODIUM CHLORIDE 0.9 % IV SOLN
INTRAVENOUS | Status: DC
  Administered 2014-12-06 – 2014-12-12 (×11): via INTRAVENOUS

## 2014-12-06 MED ORDER — LORAZEPAM 2 MG/ML IJ SOLN: 1.0000 mg | INTRAMUSCULAR | Status: DC | PRN

## 2014-12-06 MED ORDER — SODIUM CHLORIDE 0.9 % IV SOLN
12.5000 mg | Freq: Once | INTRAVENOUS | Status: AC
  Administered 2014-12-06: 12.5 mg via INTRAVENOUS
  Filled 2014-12-06: qty 0.5

## 2014-12-06 MED ORDER — ACETAMINOPHEN 325 MG PO TABS
650.0000 mg | ORAL_TABLET | Freq: Four times a day (QID) | ORAL | Status: DC | PRN
  Administered 2014-12-06 – 2014-12-08 (×6): 650 mg via ORAL
  Filled 2014-12-06 (×5): qty 2

## 2014-12-06 MED ORDER — SODIUM CHLORIDE 0.9 % IV SOLN: INTRAVENOUS | Status: AC

## 2014-12-06 MED ORDER — MAGNESIUM SULFATE 2 GM/50ML IV SOLN
2.0000 g | Freq: Once | INTRAVENOUS | Status: AC
  Administered 2014-12-06: 2 g via INTRAVENOUS
  Filled 2014-12-06: qty 50

## 2014-12-06 NOTE — Progress Notes (Addendum)
Pt arrived from ED via stretcher. Pt alert and following commands. Patient is transgender and identifies as a male. Patient is not speaking due to the soreness in her throat. CHG bath given and MRSA swab sent to lab.   Upon initial assessment, multiple horizontal and vertical scars were found on her right forearm. RN asked her what they were, and the patient mouthed the word "cuts". RN asked if she did them herself, and the patient mouthed "yes" and nodded. RN asked if the patient had any suicidal thought, and the patient the mouthed "no". Patient says she last cut herself a week ago.

## 2014-12-06 NOTE — Progress Notes (Signed)
Patient seen and examined. Admitted after midnight secondary to nausea, vomiting and AMS. She reported ingestion of ayahuasca tea at a friends house. Chronically on zoloft. Patient has subsequent developed tachycardia, myoclonus and fever. High concerns for serotonin syndrome. Admitted for further evaluation and treatment. Please refer to H&P written by Dr. Lovell SheehanJenkins for further info/details on admission.  Plan: -protecting airways -will continue aggressive IVF's -replete electrolytes  -will give one dose of thorazine  -close observation in stepdown -holding SSRI  Miguel LollMadera, Miguel [name] [phone]

## 2014-12-06 NOTE — H&P (Signed)
[facility] Admission History and Physical       Miguel [name] Currier GNF:[id number] DOB: 04/05/1990 DOA: 12/05/2014  Referring physician: EDP PCP: No primary care provider on file.  Specialists:   Chief Complaint: Nausea and Vomiting and Palpitations  HPI: Miguel [name] Henk is a 24 y.o. Hispanic male  transgender Male with a history of Depression on Zoloft Rx who was brought to the ED with complaints of increased Nausea, vomiting, Palpitations and Confusion which developed gradually after drinking " [place] Tea" which is a combination of herbs.   She was found to have Tachycardia with a heart rate in the 130-140s.    She was diagnosed with Serotonin in Syndrome and Poison Control had been contacted by the EDP.   She was administered IV Ativan and IVFs for Fluid Resuscitation and referred for admission.   Poison Control recommended administration of Cryoheptidine 8 mg PO x 1 and continued cardiac and Pulmonary monitoring.     Review of Systems:  Constitutional: No Weight Loss, No Weight Gain, Night Sweats, Fevers, Chills, Dizziness, Light Headedness, Fatigue, or Generalized Weakness HEENT: No Headaches, Difficulty Swallowing,Tooth/Dental Problems,Sore Throat,  No Sneezing, Rhinitis, Ear Ache, Nasal Congestion, or Post Nasal Drip,  Cardio-vascular:  +Chest pain, Orthopnea, PND, Edema in Lower Extremities, Anasarca, Dizziness, +Palpitations  Resp: No Dyspnea, No DOE, No Productive Cough, No Non-Productive Cough, No Hemoptysis, No Wheezing.    GI: No +Heartburn, Indigestion, Abdominal Pain, +Nausea, +Vomiting, Diarrhea, Constipation, Hematemesis, Hematochezia, Melena, Change in Bowel Habits,  Loss of Appetite  GU: No Dysuria, No Change in Color of Urine, No Urgency or Urinary Frequency, No Flank pain.  Musculoskeletal: No Joint Pain or Swelling, No Decreased Range of Motion, No Back Pain.  Neurologic: No Syncope, No Seizures, Muscle Weakness, Paresthesia, Vision Disturbance or Loss, No Diplopia,  No Vertigo, No Difficulty Walking,  Skin: No Rash or Lesions. Psych: No Change in Mood or Affect, No Depression or Anxiety, No Memory loss, No Confusion, or Hallucinations   History reviewed. No pertinent past medical history.   History reviewed. No pertinent past surgical history.    Prior to Admission medications   Medication Sig Start Date End Date Taking? Authorizing Provider  estradiol (ESTRACE) 1 MG tablet Take 2.5 mg by mouth 2 (two) times daily. 10/27/14  Yes Historical Provider, MD  sertraline (ZOLOFT) 100 MG tablet Take 100 mg by mouth daily. 11/22/14  Yes Historical Provider, MD  spironolactone (ALDACTONE) 100 MG tablet Take 100 mg by mouth 2 (two) times daily. 11/22/14  Yes Historical Provider, MD     No Known Allergies   Social History:  has no tobacco, alcohol, and drug history on file.     No family history on file.     Physical Exam:  GEN:  Pleasant Well Nourished and Well Developed  24 y.o. Hispanic male Transgender Male examined and in no acute distress; cooperative with exam Filed Vitals:   12/05/14 2315 12/05/14 2315 12/05/14 2345 12/05/14 2352  BP: 104/54  110/47   Pulse: 132  128   Temp:  100.3 F (37.9 C)  101 F (38.3 C)  TempSrc:  Oral  Rectal  Resp: 14  20   SpO2: 92%  95%    Blood pressure 110/47, pulse 128, temperature 101 F (38.3 C), temperature source Rectal, resp. rate 20, SpO2 95 %. PSYCH: She is alert and oriented x4; does not appear anxious does not appear depressed; affect is normal HEENT: Normocephalic and Atraumatic, Mucous membranes pink; PERRLA; EOM intact; Fundi:  Benign;  No scleral icterus, Nares: Patent, Oropharynx: Clear, Fair Dentition,    Neck:  FROM, No Cervical Lymphadenopathy nor Thyromegaly or Carotid Bruit; No JVD; Breasts:: Not examined CHEST WALL: No tenderness CHEST: Normal respiration, clear to auscultation bilaterally HEART:  Tachycardic Regular Rhythm; no murmurs rubs or gallops BACK: No kyphosis or  scoliosis; No CVA tenderness ABDOMEN: Positive Bowel Sounds, Soft Non-Tender, No Rebound or Guarding; No Masses, No Organomegaly. Rectal Exam: Not done EXTREMITIES: No Cyanosis, Clubbing, or Edema; No Ulcerations. Genitalia: not examined PULSES: 2+ and symmetric SKIN: Normal hydration no rash or ulceration CNS:  Alert and Oriented x 4, No Focal Deficits Vascular: pulses palpable throughout    Labs on Admission:  Basic Metabolic Panel:  Recent Labs Lab 12/05/14 1815  NA 138  K 2.9*  CL 100*  CO2 24  GLUCOSE 189*  BUN 16  CREATININE 0.89  CALCIUM 9.0   Liver Function Tests:  Recent Labs Lab 12/05/14 1815  AST 26  ALT 20  ALKPHOS 73  BILITOT 0.4  PROT 6.8  ALBUMIN 3.8   No results for input(s): LIPASE, AMYLASE in the last 168 hours. No results for input(s): AMMONIA in the last 168 hours. CBC:  Recent Labs Lab 12/05/14 1815  WBC 17.8*  NEUTROABS 15.7*  HGB 12.9*  HCT 38.6*  MCV 89.6  PLT 279   Cardiac Enzymes: No results for input(s): CKTOTAL, CKMB, CKMBINDEX, TROPONINI in the last 168 hours.  BNP (last 3 results) No results for input(s): BNP in the last 8760 hours.  ProBNP (last 3 results) No results for input(s): PROBNP in the last 8760 hours.  CBG: No results for input(s): GLUCAP in the last 168 hours.  Radiological Exams on Admission: Dg Chest Portable 1 View  12/05/2014  CLINICAL DATA:  Drug overdose. EXAM: PORTABLE CHEST 1 VIEW COMPARISON:  None. FINDINGS: The heart size and mediastinal contours are within normal limits. Both lungs are clear. The visualized skeletal structures are unremarkable. IMPRESSION: Normal chest x-ray. Electronically Signed   By: Rudie Meyer M.D.   On: 12/05/2014 21:30     EKG: Independently reviewed. Sinus Tachycardia    Assessment/Plan:      24 y.o. male with  Principal Problem:   1.    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    Major depressive disorder, recurrent episode (HCC)   Holding Sertraline for now due to #1     3.    DVT Prophylaxis   Lovenox    Code Status:     FULL CODE       Family Communication:   Significant Other at Bedside    Disposition Plan:    Inpatient  Status        Time spent:  97 Minutes      Ron Parker [facility] Pager [phone]   If 7AM -7PM Please Contact the Day Rounding Team MD for [facility]  If 7PM-7AM, Please Contact Night-Floor Coverage  [email or web address] Password [email or web address] 12/06/2014, 12:23 AM     ADDENDUM:   Patient was seen and examined on 12/06/2014

## 2014-12-06 NOTE — Progress Notes (Signed)
Utilization Review Completed.  

## 2014-12-06 NOTE — Progress Notes (Signed)
Pt slept throughout shift. Arousable for care. Tolerating clear diet with crushed meds. Pt whispers requests to partner to pass to nurse.Throat appears red, not swollen. Pupils equal and reactive.

## 2014-12-07 ENCOUNTER — Encounter (HOSPITAL_COMMUNITY): Payer: Self-pay

## 2014-12-07 DIAGNOSIS — E876 Hypokalemia: Secondary | ICD-10-CM

## 2014-12-07 DIAGNOSIS — K209 Esophagitis, unspecified: Secondary | ICD-10-CM

## 2014-12-07 DIAGNOSIS — F330 Major depressive disorder, recurrent, mild: Secondary | ICD-10-CM

## 2014-12-07 LAB — BASIC METABOLIC PANEL
Anion gap: 8 (ref 5–15)
BUN: 7 mg/dL (ref 6–20)
CHLORIDE: 103 mmol/L (ref 101–111)
CO2: 25 mmol/L (ref 22–32)
Calcium: 8.3 mg/dL — ABNORMAL LOW (ref 8.9–10.3)
Creatinine, Ser: 0.76 mg/dL (ref 0.61–1.24)
GFR calc non Af Amer: >60 mL/min (ref >60)
Glucose, Bld: 126 mg/dL — ABNORMAL HIGH (ref 65–99)
POTASSIUM: 3.6 mmol/L (ref 3.5–5.1)
SODIUM: 136 mmol/L (ref 135–145)

## 2014-12-07 LAB — CBC
HCT: 34.5 % — ABNORMAL LOW (ref 39.0–52.0)
HEMOGLOBIN: 11.2 g/dL — AB (ref 13.0–17.0)
MCH: 29.7 pg (ref 26.0–34.0)
MCHC: 32.5 g/dL (ref 30.0–36.0)
MCV: 91.5 fL (ref 78.0–100.0)
Platelets: 217 10*3/uL (ref 150–400)
RBC: 3.77 MIL/uL — AB (ref 4.22–5.81)
RDW: 13.2 % (ref 11.5–15.5)
WBC: 12.7 10*3/uL — ABNORMAL HIGH (ref 4.0–10.5)

## 2014-12-07 LAB — MAGNESIUM: Magnesium: 1.9 mg/dL (ref 1.7–2.4)

## 2014-12-07 MED ORDER — SODIUM CHLORIDE 0.9 % IV SOLN
12.5000 mg | Freq: Once | INTRAVENOUS | Status: AC
  Administered 2014-12-07: 12.5 mg via INTRAVENOUS
  Filled 2014-12-07: qty 0.5

## 2014-12-07 MED ORDER — GI COCKTAIL ~~LOC~~
30.0000 mL | Freq: Three times a day (TID) | ORAL | Status: DC | PRN
  Administered 2014-12-07 – 2014-12-09 (×8): 30 mL via ORAL
  Filled 2014-12-07 (×10): qty 30

## 2014-12-07 MED ORDER — GI COCKTAIL ~~LOC~~
30.0000 mL | Freq: Once | ORAL | Status: AC
  Administered 2014-12-07: 30 mL via ORAL
  Filled 2014-12-07: qty 30

## 2014-12-07 NOTE — Progress Notes (Signed)
Pt complaining of worsening throat pain that radiates to the chest, pt describes it as a burning and stabbing pain. Oxycodone given, np made aware.

## 2014-12-07 NOTE — Progress Notes (Signed)
Patient found to have evidence of cutting. Last stated occurrence was a week prior to admission. Denies any current desire or urge to hurt herself. Doctor was notified, but patient does not appear to be a harm to herself at this time. Miguel GensStefanie A Kaylia Winborne, RN

## 2014-12-07 NOTE — Progress Notes (Signed)
Report given to PetreyZahra, RN on 5W. Alerted to patient condition and plan of care. Patient will be transported with boyfriend. Ipad, clothing, and headphones sent with patient.

## 2014-12-07 NOTE — Progress Notes (Signed)
[facility] PROGRESS NOTE  Miguel [name] ZOX:[id number] DOB: 02-26-1990 DOA: 12/05/2014 PCP: No primary care provider on file.  Assessment/Plan: 1-serotonin syndrome: due to use of zoloft along with ingestion of [place] tea (Ayahuasca) -slowly improving  -will repeat dose of thorazine  -continue IVF's -zoloft on hold -will follow response -CK and TSH WNL -HR improved  2-depression: no SI or hallucinations -will hold zoloft for now  3-hypokalemia/hypomagnesemia  -will replete as needed  4-hyperglycemia -will check A1C  5-esophagitis: due to caustic drink -will continue PPI, carafate and PRN GI cocktails   Code Status: Full Family Communication:significant other at bedside Disposition Plan: will move out of stepdown to telemetry bed, continue supportive care    Consultants:  None   Procedures:  See below for x-ray reports   Antibiotics:  None   HPI/Subjective: Low grade temp today; complaining of burning pain in entire esophagus/throat. No nausea, no vomiting   Objective: Filed Vitals:   12/07/14 1234  BP: 127/70  Pulse:   Temp: 100.7 F (38.2 C)  Resp: 28    Intake/Output Summary (Last 24 hours) at 12/07/14 1303 Last data filed at 12/06/14 1800  Gross per 24 hour  Intake    990 ml  Output      0 ml  Net    990 ml   Filed Weights   12/06/14 0200  Weight: 75.2 kg (165 lb 12.6 oz)    Exam:   General:  Afebrile, complaining of pain in mouth, throat and epigastric area. No nausea or vomiting  Cardiovascular: sinus tachycardia, no rubs, no gallops, no murmurs   Respiratory: CTA bilaterally  Abdomen: soft, ND, positive BS  Musculoskeletal: no edema or cyanosis   Data Reviewed: Basic Metabolic Panel:  Recent Labs Lab 12/05/14 1815 12/06/14 0245 12/07/14 0255  NA 138 132* 136  K 2.9* 4.2 3.6  CL 100* 98* 103  CO2 GLUCOSE 189* 167* 126*  BUN CREATININE 0.89 0.78 0.76  CALCIUM 9.0 8.9 8.3*  MG  --  1.3* 1.9    Liver Function Tests:  Recent Labs Lab 12/05/14 1815  AST 26  ALT 20  ALKPHOS 73  BILITOT 0.4  PROT 6.8  ALBUMIN 3.8   CBC:  Recent Labs Lab 12/05/14 1815 12/06/14 0245 12/07/14 0255  WBC 17.8* 16.7* 12.7*  NEUTROABS 15.7*  --   --   HGB 12.9* 11.4* 11.2*  HCT 38.6* 34.7* 34.5*  MCV 89.6 90.1 91.5  PLT 279 256 217   Cardiac Enzymes:  Recent Labs Lab 12/06/14 0245  CKTOTAL 58    Recent Results (from the past 240 hour(s))  MRSA PCR Screening     Status: None   Collection Time: 12/06/14  2:48 AM  Result Value Ref Range Status   MRSA by PCR NEGATIVE NEGATIVE Final    Comment:        The GeneXpert MRSA Assay (FDA approved for NASAL specimens only), is one component of a comprehensive MRSA colonization surveillance program. It is not intended to diagnose MRSA infection nor to guide or monitor treatment for MRSA infections.      Studies: Dg Chest Portable 1 View  12/05/2014  CLINICAL DATA:  Drug overdose. EXAM: PORTABLE CHEST 1 VIEW COMPARISON:  None. FINDINGS: The heart size and mediastinal contours are within normal limits. Both lungs are clear. The visualized skeletal structures are unremarkable. IMPRESSION: Normal chest x-ray. Electronically Signed   By: Rudie Meyer M.D.   On: 12/05/2014 21:30  Scheduled Meds: . enoxaparin (LOVENOX) injection  40 mg Subcutaneous Q24H  . pantoprazole (PROTONIX) IV  40 mg Intravenous Q24H  . sucralfate  1 g Oral TID WC & HS   Continuous Infusions: . sodium chloride 125 mL/hr at 12/07/14 [id number]    Principal Problem:   Serotonin syndrome Active Problems:   Toxic effect of other ingested (parts of) plant(s), undetermined, initial encounter   Major depressive disorder, recurrent episode (HCC)    Time spent: 35 minutes    Vassie LollMadera, Costella Schwarz  [facility] Pager [phone]. If 7PM-7AM, please contact night-coverage at [email or web address], password [email or web address] 12/07/2014, 1:03 PM  LOS: 1 day

## 2014-12-08 LAB — HEMOGLOBIN A1C
Hgb A1c MFr Bld: 5.6 % (ref 4.8–5.6)
MEAN PLASMA GLUCOSE: 114 mg/dL

## 2014-12-08 LAB — BASIC METABOLIC PANEL
Anion gap: 7 (ref 5–15)
CALCIUM: 8.4 mg/dL — AB (ref 8.9–10.3)
CHLORIDE: 102 mmol/L (ref 101–111)
CO2: 29 mmol/L (ref 22–32)
CREATININE: 0.72 mg/dL (ref 0.61–1.24)
Glucose, Bld: 100 mg/dL — ABNORMAL HIGH (ref 65–99)
Potassium: 4 mmol/L (ref 3.5–5.1)
SODIUM: 138 mmol/L (ref 135–145)

## 2014-12-08 LAB — MAGNESIUM: MAGNESIUM: 1.8 mg/dL (ref 1.7–2.4)

## 2014-12-08 MED ORDER — PANTOPRAZOLE SODIUM 40 MG PO TBEC
40.0000 mg | DELAYED_RELEASE_TABLET | Freq: Two times a day (BID) | ORAL | Status: DC
  Administered 2014-12-08 – 2014-12-09 (×4): 40 mg via ORAL
  Filled 2014-12-08 (×4): qty 1

## 2014-12-08 MED ORDER — LORAZEPAM 1 MG PO TABS
1.0000 mg | ORAL_TABLET | Freq: Three times a day (TID) | ORAL | Status: DC | PRN
  Administered 2014-12-08 (×2): 1 mg via ORAL
  Filled 2014-12-08 (×2): qty 1

## 2014-12-08 NOTE — Progress Notes (Signed)
Spoke with poison control MD this AM, recommendations noted, and spoke with Dr. Gwenlyn PerkingMadera as well. Will continue to hold zoloft at this time and monitor patient. Call bell is within reach.

## 2014-12-08 NOTE — Care Management Note (Signed)
Case Management Note  Patient Details  Name: Miguel [name] MRN: [id number] Date of Birth: 10/03/1990  Subjective/Objective:                  Date-12-08-14 Initial Assessment Patient transferred from another unit. Spoke with patient at the bedside along with friend Molli HazardMatthew, [phone].  Introduced self as Sports coachcase manager and explained role in discharge planning and how to be reached.  [place].  Verified patient anticipates to go home with room mates at time of discharge.  Patient has no DME.. Expressed potential need for no other DME.  Patient gender identifies as male and states she has [employer] insurance as father is a a Emergency planning/management officerpolice officer in [place]. Patient walks or rides a bike, room mates drive, to MD appointments. Verified patient [facility] [facility] [place]. Patient states she can schedule her own follow up, and is currently active with MD. Patient states she has been seen in last 6 months, and has Rx's at home from her MD.   Plan: CM will continue to follow for discharge planning and [facility] resources.   Lawerance Sabalebbie Shellie Goettl RN BSN CM [phone]   Action/Plan:   Expected Discharge Date:                  Expected Discharge Plan:  Home/Self Care  In-House Referral:  Clinical Social Work  Discharge planning Services  CM Consult  Post Acute Care Choice:    Choice offered to:     DME Arranged:    DME Agency:     HH Arranged:    HH Agency:     Status of Service:  In process, will continue to follow  Medicare Important Message Given:    Date Medicare IM Given:    Medicare IM give by:    Date Additional Medicare IM Given:    Additional Medicare Important Message give by:     If discussed at Long Length of Stay Meetings, dates discussed:    Additional Comments:  Lawerance SabalDebbie Raisha Brabender, RN 12/08/2014, 10:27 AM

## 2014-12-08 NOTE — Progress Notes (Signed)
[facility] PROGRESS NOTE  Miguel [name] ZOX:[id number] DOB: 06/16/1990 DOA: 12/05/2014 PCP: No primary care provider on file.  Assessment/Plan: 1-serotonin syndrome: due to use of zoloft along with ingestion of [place] tea (Ayahu[place]a) -will use PRN benzodiazepine  -continue IVF's -zoloft on hold -will follow response and rec's from poison control center -CK and TSH WNL -HR continue improving  -PRN antiemetics and antipyretics   2-depression: no SI or hallucinations -will continue holding zoloft for now  3-hypokalemia/hypomagnesemia  -repleted -will monitor and replete further as needed   4-hyperglycemia -A1C 5.6  5-esophagitis: due to caustic drink -will continue PPI, carafate and PRN GI cocktails  -will advance diet to full liquid   Code Status: Full Family Communication:significant other at bedside Disposition Plan: will watch for another 24 hours; continue supportive care, advance diet and switch medications to PO. PRN benzodiazepine as recommended by poison control center.  Consultants:  None   Procedures:  See below for x-ray reports   Antibiotics:  None   HPI/Subjective: Low grade temp overnight; and with main complaints of burning pain in entire esophagus/throat. No nausea, no vomiting. Tolerating CLD.   Objective: Filed Vitals:   12/08/14 0601  BP: 93/55  Pulse: 104  Temp: 100.2 F (37.9 C)  Resp: 18    Intake/Output Summary (Last 24 hours) at 12/08/14 1336 Last data filed at 12/08/14 1312  Gross per 24 hour  Intake 3730.83 ml  Output   2950 ml  Net 780.83 ml   Filed Weights   12/06/14 0200 12/07/14 1644  Weight: 75.2 kg (165 lb 12.6 oz) 74.8 kg (164 lb 14.5 oz)    Exam:   General:  Afebrile, complaining of pain in mouth, throat and epigastric area. No nausea or vomiting. AAOX3 and tolerating CLD w/o problems.  Cardiovascular: sinus tachycardia, no rubs, no gallops, no murmurs   Respiratory: CTA bilaterally  Abdomen: soft,  ND, positive BS  Musculoskeletal: no edema or cyanosis   Data Reviewed: Basic Metabolic Panel:  Recent Labs Lab 12/05/14 1815 12/06/14 0245 12/07/14 0255 12/08/14 0400  NA 138 132* 136 138  K 2.9* 4.2 3.6 4.0  CL 100* 98* 103 102  CO2 24 25 25 29   GLUCOSE 189* 167* 126* 100*  BUN 16 10 7  <5*  CREATININE 0.89 0.78 0.76 0.72  CALCIUM 9.0 8.9 8.3* 8.4*  MG  --  1.3* 1.9 1.8   Liver Function Tests:  Recent Labs Lab 12/05/14 1815  AST 26  ALT 20  ALKPHOS 73  BILITOT 0.4  PROT 6.8  ALBUMIN 3.8   CBC:  Recent Labs Lab 12/05/14 1815 12/06/14 0245 12/07/14 0255  WBC 17.8* 16.7* 12.7*  NEUTROABS 15.7*  --   --   HGB 12.9* 11.4* 11.2*  HCT 38.6* 34.7* 34.5*  MCV 89.6 90.1 91.5  PLT 279 256 217   Cardiac Enzymes:  Recent Labs Lab 12/06/14 0245  CKTOTAL 58    Recent Results (from the past 240 hour(s))  MRSA PCR Screening     Status: None   Collection Time: 12/06/14  2:48 AM  Result Value Ref Range Status   MRSA by PCR NEGATIVE NEGATIVE Final    Comment:        The GeneXpert MRSA Assay (FDA approved for NASAL specimens only), is one component of a comprehensive MRSA colonization surveillance program. It is not intended to diagnose MRSA infection nor to guide or monitor treatment for MRSA infections.      Studies: No results found.  Scheduled Meds: .  enoxaparin (LOVENOX) injection  40 mg Subcutaneous Q24H  . pantoprazole  40 mg Oral BID  . sucralfate  1 g Oral TID WC & HS   Continuous Infusions: . sodium chloride 125 mL/hr at 12/08/14 1044    Principal Problem:   Serotonin syndrome Active Problems:   Toxic effect of other ingested (parts of) plant(s), undetermined, initial encounter   Major depressive disorder, recurrent episode (HCC)    Time spent: 35 minutes    Vassie Loll  [facility] Pager [phone]. If 7PM-7AM, please contact night-coverage at [email or web address], password [email or web address] 12/08/2014, 1:36 PM  LOS: 2 days

## 2014-12-09 ENCOUNTER — Encounter (HOSPITAL_COMMUNITY): Payer: Self-pay | Admitting: Physician Assistant

## 2014-12-09 DIAGNOSIS — R509 Fever, unspecified: Secondary | ICD-10-CM

## 2014-12-09 LAB — URINALYSIS, ROUTINE W REFLEX MICROSCOPIC
BILIRUBIN URINE: NEGATIVE
Glucose, UA: NEGATIVE mg/dL
HGB URINE DIPSTICK: NEGATIVE
KETONES UR: 15 mg/dL — AB
Leukocytes, UA: NEGATIVE
Nitrite: NEGATIVE
PROTEIN: NEGATIVE mg/dL
Specific Gravity, Urine: 1.018 (ref 1.005–1.030)
UROBILINOGEN UA: 1 mg/dL (ref 0.0–1.0)
pH: 7 (ref 5.0–8.0)

## 2014-12-09 LAB — BASIC METABOLIC PANEL
Anion gap: 8 (ref 5–15)
CO2: 29 mmol/L (ref 22–32)
CREATININE: 0.72 mg/dL (ref 0.61–1.24)
Calcium: 8.8 mg/dL — ABNORMAL LOW (ref 8.9–10.3)
Chloride: 100 mmol/L — ABNORMAL LOW (ref 101–111)
GFR calc Af Amer: >60 mL/min (ref >60)
Glucose, Bld: 113 mg/dL — ABNORMAL HIGH (ref 65–99)
POTASSIUM: 3.4 mmol/L — AB (ref 3.5–5.1)
SODIUM: 137 mmol/L (ref 135–145)

## 2014-12-09 LAB — CBC
HCT: 37.1 % — ABNORMAL LOW (ref 39.0–52.0)
Hemoglobin: 12.1 g/dL — ABNORMAL LOW (ref 13.0–17.0)
MCH: 29.8 pg (ref 26.0–34.0)
MCHC: 32.6 g/dL (ref 30.0–36.0)
MCV: 91.4 fL (ref 78.0–100.0)
PLATELETS: 277 10*3/uL (ref 150–400)
RBC: 4.06 MIL/uL — AB (ref 4.22–5.81)
RDW: 12.6 % (ref 11.5–15.5)
WBC: 8 10*3/uL (ref 4.0–10.5)

## 2014-12-09 LAB — HEPATIC FUNCTION PANEL
ALT: 13 U/L — ABNORMAL LOW (ref 17–63)
AST: 10 U/L — AB (ref 15–41)
Albumin: 2.8 g/dL — ABNORMAL LOW (ref 3.5–5.0)
Alkaline Phosphatase: 60 U/L (ref 38–126)
Total Bilirubin: 0.4 mg/dL (ref 0.3–1.2)
Total Protein: 6 g/dL — ABNORMAL LOW (ref 6.5–8.1)

## 2014-12-09 LAB — HIV ANTIBODY (ROUTINE TESTING W REFLEX): HIV SCREEN 4TH GENERATION: NONREACTIVE

## 2014-12-09 LAB — MAGNESIUM: MAGNESIUM: 1.9 mg/dL (ref 1.7–2.4)

## 2014-12-09 LAB — PROCALCITONIN: Procalcitonin: 0.24 ng/mL

## 2014-12-09 LAB — LACTIC ACID, PLASMA: LACTIC ACID, VENOUS: 1.9 mmol/L (ref 0.5–2.0)

## 2014-12-09 NOTE — Consult Note (Signed)
Rozell Kettlewell, EdD 

## 2014-12-09 NOTE — Consult Note (Signed)
[facility] Consult: 3:39 PM 12/09/2014  LOS: 3 days    Referring Provider: Dr Tat  Primary Care Physician:  unknown Primary Gastroenterologist:  unassigned    Reason for Consultation:  Odynophagia   HPI: Zain Lankford is a 24 y.o. Transgender male.  Goes by name of [name]. On estradiol replacement. History depression.     Patient developed acute esophageal burning pain and nausea and vomiting after drinking "[place]" tea ([place]). Diagnosed with serotonin syndrome with tachycardia in the 130s to 140s. This was treated with Thorazine, Ativan, IV fluids and cryoheptadine.  Admitted afterwards for cardiac/pulmonary monitoring.  GI meds include Protonix twice daily since 11/6 and Carafate since 11/6. Continues to complain of odynophagia.  This helps and allows her to swallow but still having pain and excess salivation, having to spit into a cup.  No bloody emesis.      Past Medical History  Diagnosis Date  . 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 Polysubstance abuse 07/2014  . Depression 07/2014.  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 Appendectomy  age 54    Prior to Admission medications   Medication Sig Start Date End Date Taking? Authorizing Provider  estradiol (ESTRACE) 1 MG tablet Take 2.5 mg by mouth 2 (two) times daily. 10/27/14  Yes Historical Provider, MD  sertraline (ZOLOFT) 100 MG tablet Take 100 mg by mouth daily. 11/22/14  Yes Historical Provider, MD  spironolactone (ALDACTONE) 100 MG tablet Take 100 mg by mouth 2 (two) times daily. 11/22/14  Yes Historical Provider, MD    Scheduled Meds: . enoxaparin (LOVENOX) injection  40 mg Subcutaneous Q24H  . pantoprazole  40 mg Oral BID  . sucralfate  1 g Oral TID WC & HS    Infusions: . sodium chloride 125 mL/hr at 12/09/14 0957   PRN Meds: gi cocktail, LORazepam, oxyCODONE   Allergies as of 12/05/2014  . (No Known Allergies)    History reviewed. No pertinent family history.  Social History   Social History  . Marital Status: Single    Spouse Name: N/A  . Number of Children: N/A  . Years of Education: N/A   Occupational History  . Not on file.   Social History Main Topics  . Smoking status: Never Smoker   . Smokeless tobacco: Not on file  . Alcohol Use: Not on file  . Drug Use: Not on file  . Sexual Activity: Not on file   Other Topics Concern  . Not on file   Social History Narrative    REVIEW OF SYSTEMS: Constitutional:  Stable weight, good energy.  Normally feels well ENT:  No nose bleeds Pulm:  No SOB or cough CV:  No palpitations, no LE edema.  GU:  No hematuria, no frequency GI:  Per HPI.  stolls normal Heme:  No issues with excessive bleeding or bruising   Transfusions:  none Neuro:  No headaches, no peripheral tingling or numbness Derm:  No itching, no rash or sores.  Endocrine:  No sweats or chills.  No polyuria or dysuria Immunization:  Did not inquire Travel:  None beyond local counties in last few months.    PHYSICAL EXAM: Vital signs in last 24 hours: Filed Vitals:   12/09/14 1237  BP: 108/69  Pulse: 101  Temp: 101.1 F (38.4 C)  Resp: 16   Wt Readings from Last 3 Encounters:  12/07/14 164 lb 14.5 oz (74.8 kg)   General: pleasant, well appearing.  Head:  No swelling or asymmetry  Eyes:  No icterus or pallor Ears:  Not HPH  Nose:  No congestion or discharge Mouth:  Clear, moist MM Neck:  No mass, slight tenderness, no JVD Lungs:  Clear bil. No chest wall tenderness Heart: RRR.  No mrg Abdomen:  Soft, active BS.  Not tender or distended.  No mass or HSM Rectal: deferred   Musc/Skeltl: no joint swelling, deformity or erythema Extremities:  No CCE  Neurologic:  Oriented x 3.  No limb weakness.   No tremor Skin:  No rash.  Cutting scars on right forearm.  Nodes:  No cervical adenopathy.    Psych:  Pleasant, cooperative, affect normal.   Intake/Output from previous day: 11/08 0701 - 11/09 0700 In: 3752.9 [P.O.:780; I.V.:2972.9] Out: 3475 [Urine:3475] Intake/Output this shift: Total I/O In: -  Out: 1030 [Urine:850; Emesis/NG output:180]  LAB RESULTS:  Recent Labs  12/07/14 0255 12/09/14 0638  WBC 12.7* 8.0  HGB 11.2* 12.1*  HCT 34.5* 37.1*  PLT 217 277   BMET Lab Results  Component Value Date   NA 137 12/09/2014   NA 138 12/08/2014   NA 136 12/07/2014   K 3.4* 12/09/2014   K 4.0 12/08/2014   K 3.6 12/07/2014   CL 100* 12/09/2014   CL 102 12/08/2014   CL 103 12/07/2014   CO2 29 12/09/2014   CO2 29 12/08/2014   CO2 25 12/07/2014   GLUCOSE 113* 12/09/2014   GLUCOSE 100* 12/08/2014   GLUCOSE 126* 12/07/2014   BUN <5* 12/09/2014   BUN <5* 12/08/2014   BUN 7 12/07/2014   CREATININE 0.72 12/09/2014   CREATININE 0.72 12/08/2014   CREATININE 0.76 12/07/2014   CALCIUM 8.8* 12/09/2014   CALCIUM 8.4* 12/08/2014   CALCIUM 8.3* 12/07/2014   LFT  Recent Labs  12/09/14 0724  PROT 6.0*  ALBUMIN 2.8*  AST 10*  ALT 13*  ALKPHOS 60  BILITOT 0.4  BILIDIR <0.1*  IBILI NOT CALCULATED   PT/INR No results found for: INR, PROTIME Hepatitis Panel No results for input(s): HEPBSAG, HCVAB, HEPAIGM, HEPBIGM in the last 72 hours. C-Diff No components found for: CDIFF Lipase  No results found for: LIPASE  Drugs of Abuse     Component Value Date/Time   LABOPIA NONE DETECTED 12/05/2014 1954   COCAINSCRNUR NONE DETECTED 12/05/2014 1954   LABBENZ POSITIVE* 12/05/2014 1954   AMPHETMU NONE DETECTED 12/05/2014 1954   THCU POSITIVE* 12/05/2014 1954   LABBARB NONE DETECTED 12/05/2014 1954     RADIOLOGY STUDIES: No results found.  ENDOSCOPIC STUDIES: None ever  IMPRESSION:   *  Odynophagia following ingestion of "[place]" tea ([place]).  Rule out caustic  reaction, vs allergic reaction.   *  Substance abuse.  *  Transgender, on estradiol.   PLAN:     *  Plan upper endoscopy tomorrow.   D/w pt and she is agreeable.    Jennye MoccasinSarah Gribbin  12/09/2014, 3:39 PM Pager: [phone]  Attending Addendum I have taken an interval history, reviewed the chart, and  examined the patient. I agree with the Advanced Practitioner's note and impression. Patient developed odynophagia since ingestion of "[place] Tea" about 5 days ago. Unclear what exactly is in this, she reports someone made it for her and did not know the ingredients. She can swallow but has been spitting out secretions to avoid discomfort. No vomiting or dysphagia, only discomfort. Suspect she most likely may have had a caustic reaction to her ingestion, although also endorses some oral ulcerations (which I did not appreciate on exam) and could have HSV esophagitis, or other infectious esophagitis (candida, etc) or erosive esophagitis. Recommend EGD. The indications, risks, and benefits of EGD were explained to the patient in detail. Risks include but are not limited to bleeding, perforation, adverse reaction to medications, and cardiopulmonary compromise. The patient verbalized understanding and wished to proceed.Further recommendations pending results of the exam. NPO after midnight. Continue PPI, Carafate, and GI cocktail with lidocaine PRN which seems to significantly help.   Ileene Patrick, MD [facility] Pager [phone]     LOS: 3 days   Reeves Forth Iolanda Folson  12/09/2014, 7:54 PM

## 2014-12-09 NOTE — Progress Notes (Signed)
PROGRESS NOTE  Miguel [name] QMV:[id number] DOB: 1990/09/17 DOA: 12/05/2014 PCP: No primary care provider on file. Brief History 23 y/o transgender male presents with nausea, vomiting, confusion, and palpitations after drinking [place] tea. According to the patient, he feels that the tea was Improperly and felt it smelled like "paint thinner". Since drinking the tea, the patient has been spitting up some blood and complaining of odynophagia. The patient has been febrile throughout the hospitalization. Was in control was contacted and initially the patient was given cyproheptadine and benzodiazepines. Unfortunately, the patient continues to have fevers without clear source. He denies any previous fevers prior to admission. There is a previous history of NGU, but he states that he was tested for HIV less than 1 year ago and was negative. He denies any genital discharge or ulcerations.  Assessment/Plan: FUO -While serotonin syndrome may have been in the differential diagnosis, the patient is not manifesting with typical clinical symptoms including altered mental status, neuromuscular abnormalities, and autonomic hyperactivity -Workup for infectious etiology -Blood cultures x2 sets -UA and urine culture -Lactic acid -procalcitonin -Chest x-ray negative for infiltrates -Continue to hold sertraline -HIV -RPR -urine NAA for GC and chlamydia Odynophagia -Suspect the patient ingested some other type of caustic substance in addition to [place] -consult GI -continue PPI and carafate Hypokalemia -Repleted Impaired glucose tolerance -Hemoglobin A1c 5.6 MDD -Denies any suicidal ideation  Family Communication:   Partner updatedat beside Disposition Plan:   Home when medically stable    Procedures/Studies: Dg Chest Portable 1 View  12/05/2014  CLINICAL DATA:  Drug overdose. EXAM: PORTABLE CHEST 1 VIEW COMPARISON:  None. FINDINGS: The heart size and mediastinal contours are within  normal limits. Both lungs are clear. The visualized skeletal structures are unremarkable. IMPRESSION: Normal chest x-ray. Electronically Signed   By: Rudie Meyer M.D.   On: 12/05/2014 21:30         Subjective: Patient continues to complain of odynophagia. Denies any headache, chest discomfort, shortness breath, vomiting, diarrhea, abdominal pain, dysuria, hematuria. No vaginal discharge. Denies any rashes or synovitis  Objective: Filed Vitals:   12/08/14 2221 12/09/14 0622 12/09/14 0807 12/09/14 1237  BP: 103/75 114/62 108/65 108/69  Pulse: 90 102 94 101  Temp: 98.3 F (36.8 C) 100.1 F (37.8 C) 99.4 F (37.4 C) 101.1 F (38.4 C)  TempSrc: Oral Oral Oral Oral  Resp: 18   16  Height:      Weight:      SpO2: 97% 98% 95% 100%    Intake/Output Summary (Last 24 hours) at 12/09/14 1458 Last data filed at 12/09/14 1238  Gross per 24 hour  Intake 3272.92 ml  Output   3605 ml  Net -332.08 ml   Weight change:  Exam:   General:  Pt is alert, follows commands appropriately, not in acute distress  HEENT: No icterus, No thrush, No neck mass, [place]/AT; no meningismus  Cardiovascular: RRR, S1/S2, no rubs, no gallops  Respiratory: CTA bilaterally, no wheezing, no crackles, no rhonchi  Abdomen: Soft/+BS, non tender, non distended, no guarding; no hepatosplenomegaly  Extremities: No edema, No lymphangitis, No petechiae, No rashes, no synovitis; no cyanosis or clubbing  Data Reviewed: Basic Metabolic Panel:  Recent Labs Lab 12/05/14 1815 12/06/14 0245 12/07/14 0255 12/08/14 0400 12/09/14 0638  NA 138 132* 136 138 137  K 2.9* 4.2 3.6 4.0 3.4*  CL 100* 98* 103 102 100*  CO2 GLUCOSE 189*  167* 126* 100* 113*  BUN 16 10 7  <5* <5*  CREATININE 0.89 0.78 0.76 0.72 0.72  CALCIUM 9.0 8.9 8.3* 8.4* 8.8*  MG  --  1.3* 1.9 1.8 1.9   Liver Function Tests:  Recent Labs Lab 12/05/14 1815 12/09/14 0724  AST 26 10*  ALT 20 13*  ALKPHOS 73 60  BILITOT 0.4 0.4    PROT 6.8 6.0*  ALBUMIN 3.8 2.8*   No results for input(s): LIPASE, AMYLASE in the last 168 hours. No results for input(s): AMMONIA in the last 168 hours. CBC:  Recent Labs Lab 12/05/14 1815 12/06/14 0245 12/07/14 0255 12/09/14 0638  WBC 17.8* 16.7* 12.7* 8.0  NEUTROABS 15.7*  --   --   --   HGB 12.9* 11.4* 11.2* 12.1*  HCT 38.6* 34.7* 34.5* 37.1*  MCV 89.6 90.1 91.5 91.4  PLT 279 256 217 277   Cardiac Enzymes:  Recent Labs Lab 12/06/14 0245  CKTOTAL 58   BNP: Invalid input(s): POCBNP CBG: No results for input(s): GLUCAP in the last 168 hours.  Recent Results (from the past 240 hour(s))  MRSA PCR Screening     Status: None   Collection Time: 12/06/14  2:48 AM  Result Value Ref Range Status   MRSA by PCR NEGATIVE NEGATIVE Final    Comment:        The GeneXpert MRSA Assay (FDA approved for NASAL specimens only), is one component of a comprehensive MRSA colonization surveillance program. It is not intended to diagnose MRSA infection nor to guide or monitor treatment for MRSA infections.      Scheduled Meds: . enoxaparin (LOVENOX) injection  40 mg Subcutaneous Q24H  . pantoprazole  40 mg Oral BID  . sucralfate  1 g Oral TID WC & HS   Continuous Infusions: . sodium chloride 125 mL/hr at 12/09/14 0957     Alois Mincer, DO  [facility] Pager [phone]  If 7PM-7AM, please contact night-coverage [email or web address] Password [email or web address] 12/09/2014, 2:58 PM   LOS: 3 days

## 2014-12-10 ENCOUNTER — Inpatient Hospital Stay (HOSPITAL_COMMUNITY): Payer: 59

## 2014-12-10 ENCOUNTER — Inpatient Hospital Stay (HOSPITAL_COMMUNITY): Payer: 59 | Admitting: Anesthesiology

## 2014-12-10 ENCOUNTER — Encounter (HOSPITAL_COMMUNITY)
Admission: EM | Disposition: A | Discharge status: Home / Self Care | Payer: Self-pay | Source: Home / Self Care | Attending: Internal Medicine

## 2014-12-10 ENCOUNTER — Encounter (HOSPITAL_COMMUNITY): Payer: Self-pay | Admitting: *Deleted

## 2014-12-10 DIAGNOSIS — T5491XA Toxic effect of unspecified corrosive substance, accidental (unintentional), initial encounter: Secondary | ICD-10-CM | POA: Insufficient documentation

## 2014-12-10 DIAGNOSIS — R4182 Altered mental status, unspecified: Secondary | ICD-10-CM | POA: Insufficient documentation

## 2014-12-10 DIAGNOSIS — R1310 Dysphagia, unspecified: Secondary | ICD-10-CM | POA: Insufficient documentation

## 2014-12-10 DIAGNOSIS — J9620 Acute and chronic respiratory failure, unspecified whether with hypoxia or hypercapnia: Secondary | ICD-10-CM

## 2014-12-10 DIAGNOSIS — T622X4A Toxic effect of other ingested (parts of) plant(s), undetermined, initial encounter: Secondary | ICD-10-CM

## 2014-12-10 DIAGNOSIS — R509 Fever, unspecified: Secondary | ICD-10-CM

## 2014-12-10 HISTORY — PX: ESOPHAGOGASTRODUODENOSCOPY: SHX5428

## 2014-12-10 LAB — BASIC METABOLIC PANEL
ANION GAP: 8 (ref 5–15)
BUN: <5 mg/dL — ABNORMAL LOW (ref 6–20)
CHLORIDE: 102 mmol/L (ref 101–111)
CO2: 27 mmol/L (ref 22–32)
CREATININE: 0.74 mg/dL (ref 0.61–1.24)
Calcium: 8.7 mg/dL — ABNORMAL LOW (ref 8.9–10.3)
GFR calc non Af Amer: >60 mL/min (ref >60)
Glucose, Bld: 111 mg/dL — ABNORMAL HIGH (ref 65–99)
Potassium: 3.5 mmol/L (ref 3.5–5.1)
Sodium: 137 mmol/L (ref 135–145)

## 2014-12-10 LAB — BLOOD GAS, ARTERIAL
ACID-BASE EXCESS: 0.9 mmol/L (ref 0.0–2.0)
BICARBONATE: 23.9 meq/L (ref 20.0–24.0)
Drawn by: 295031
FIO2: 1
LHR: 14 {breaths}/min
O2 Saturation: 99.9 %
PEEP/CPAP: 5 cmH2O
Patient temperature: 98.6
TCO2: 24.8 mmol/L (ref 0–100)
VT: 600 mL
pCO2 arterial: 31.1 mmHg — ABNORMAL LOW (ref 35.0–45.0)
pH, Arterial: 7.497 — ABNORMAL HIGH (ref 7.350–7.450)
pO2, Arterial: 474 mmHg — ABNORMAL HIGH (ref 80.0–100.0)

## 2014-12-10 LAB — MAGNESIUM: Magnesium: 1.8 mg/dL (ref 1.7–2.4)

## 2014-12-10 LAB — HIV ANTIBODY (ROUTINE TESTING W REFLEX): HIV Screen 4th Generation wRfx: NONREACTIVE

## 2014-12-10 LAB — GC/CHLAMYDIA PROBE AMP (~~LOC~~) NOT AT ARMC
Chlamydia: NEGATIVE
NEISSERIA GONORRHEA: NEGATIVE

## 2014-12-10 LAB — MRSA PCR SCREENING: MRSA BY PCR: NEGATIVE

## 2014-12-10 LAB — TRIGLYCERIDES: TRIGLYCERIDES: 122 mg/dL (ref <150)

## 2014-12-10 LAB — GLUCOSE, CAPILLARY: GLUCOSE-CAPILLARY: 98 mg/dL (ref 65–99)

## 2014-12-10 LAB — RPR: RPR Ser Ql: NONREACTIVE

## 2014-12-10 SURGERY — EGD (ESOPHAGOGASTRODUODENOSCOPY)
Anesthesia: Monitor Anesthesia Care

## 2014-12-10 MED ORDER — LACTATED RINGERS IV SOLN
INTRAVENOUS | Status: DC
  Administered 2014-12-10 (×3): via INTRAVENOUS

## 2014-12-10 MED ORDER — PROPOFOL 1000 MG/100ML IV EMUL
0.0000 ug/kg/min | INTRAVENOUS | Status: DC
  Administered 2014-12-10: 125 ug/kg/min via INTRAVENOUS
  Filled 2014-12-10: qty 100

## 2014-12-10 MED ORDER — LIDOCAINE HCL (CARDIAC) 20 MG/ML IV SOLN
INTRAVENOUS | Status: DC | PRN
  Administered 2014-12-10: 80 mg via INTRAVENOUS

## 2014-12-10 MED ORDER — IOHEXOL 300 MG/ML  SOLN
75.0000 mL | Freq: Once | INTRAMUSCULAR | Status: AC | PRN
  Administered 2014-12-10: 75 mL via INTRAVENOUS

## 2014-12-10 MED ORDER — PROPOFOL 500 MG/50ML IV EMUL
INTRAVENOUS | Status: DC | PRN
  Administered 2014-12-10: 250 ug/kg/min via INTRAVENOUS

## 2014-12-10 MED ORDER — FENTANYL CITRATE (PF) 100 MCG/2ML IJ SOLN: 100.0000 ug | INTRAMUSCULAR | Status: DC | PRN

## 2014-12-10 MED ORDER — SODIUM CHLORIDE 0.9 % IV SOLN
INTRAVENOUS | Status: DC
  Administered 2014-12-10: 18:00:00 via INTRAVENOUS

## 2014-12-10 MED ORDER — MIDAZOLAM HCL 5 MG/ML IJ SOLN
INTRAMUSCULAR | Status: AC
  Filled 2014-12-10: qty 1

## 2014-12-10 MED ORDER — FENTANYL CITRATE (PF) 100 MCG/2ML IJ SOLN
INTRAMUSCULAR | Status: AC
  Filled 2014-12-10: qty 2

## 2014-12-10 MED ORDER — FENTANYL CITRATE (PF) 100 MCG/2ML IJ SOLN
100.0000 ug | INTRAMUSCULAR | Status: DC | PRN
  Administered 2014-12-10: 100 ug via INTRAVENOUS
  Filled 2014-12-10 (×2): qty 2

## 2014-12-10 MED ORDER — SODIUM CHLORIDE 0.9 % IV SOLN
8.0000 mg/h | INTRAVENOUS | Status: AC
  Administered 2014-12-10 – 2014-12-12 (×6): 8 mg/h via INTRAVENOUS
  Filled 2014-12-10 (×14): qty 80

## 2014-12-10 MED ORDER — PROPOFOL 10 MG/ML IV BOLUS
INTRAVENOUS | Status: DC | PRN
  Administered 2014-12-10: 40 mg via INTRAVENOUS

## 2014-12-10 MED ORDER — MIDAZOLAM HCL 5 MG/5ML IJ SOLN
INTRAMUSCULAR | Status: DC | PRN
  Administered 2014-12-10: 5 mg via INTRAVENOUS

## 2014-12-10 MED ORDER — ANTISEPTIC ORAL RINSE SOLUTION (CORINZ)
7.0000 mL | Freq: Four times a day (QID) | OROMUCOSAL | Status: DC
Start: 2014-12-10 — End: 2014-12-11
  Administered 2014-12-11 (×3): 7 mL via OROMUCOSAL

## 2014-12-10 MED ORDER — PANTOPRAZOLE SODIUM 40 MG IV SOLR: 40.0000 mg | Freq: Two times a day (BID) | INTRAVENOUS | Status: DC

## 2014-12-10 MED ORDER — FENTANYL CITRATE (PF) 100 MCG/2ML IJ SOLN
INTRAMUSCULAR | Status: DC | PRN
  Administered 2014-12-10: 100 ug via INTRAVENOUS

## 2014-12-10 MED ORDER — FENTANYL CITRATE (PF) 100 MCG/2ML IJ SOLN
25.0000 ug | INTRAMUSCULAR | Status: DC | PRN
  Administered 2014-12-10 – 2014-12-11 (×7): 50 ug via INTRAVENOUS
  Administered 2014-12-11: 25 ug via INTRAVENOUS
  Administered 2014-12-11 – 2014-12-17 (×34): 50 ug via INTRAVENOUS
  Filled 2014-12-10 (×43): qty 2

## 2014-12-10 MED ORDER — SUCCINYLCHOLINE CHLORIDE 20 MG/ML IJ SOLN
INTRAMUSCULAR | Status: DC | PRN
  Administered 2014-12-10: 80 mg via INTRAVENOUS

## 2014-12-10 MED ORDER — CHLORHEXIDINE GLUCONATE 0.12% ORAL RINSE (MEDLINE KIT): 15.0000 mL | Freq: Two times a day (BID) | OROMUCOSAL | Status: DC

## 2014-12-10 NOTE — Anesthesia Procedure Notes (Signed)
Procedure Name: Intubation Date/Time: 12/10/2014 11:40 AM Performed by: Adonis HousekeeperNGELL, Danzig Macgregor M Pre-anesthesia Checklist: Emergency Drugs available, Patient being monitored and Suction available Oxygen Delivery Method: Circle system utilized Preoxygenation: Pre-oxygenation with 100% oxygen Intubation Type: IV induction and Rapid sequence Laryngoscope Size: Mac and 3 Grade View: Grade II Tube type: Oral Tube size: 7.0 mm Number of attempts: 1 Airway Equipment and Method: Stylet Placement Confirmation: ETT inserted through vocal cords under direct vision,  positive ETCO2 and breath sounds checked- equal and bilateral (difficult to visualize cords with blood and secretions ) Secured at: 25 cm Tube secured with: Tape Dental Injury: Teeth and Oropharynx as per pre-operative assessment

## 2014-12-10 NOTE — Progress Notes (Signed)
PROGRESS NOTE  Miguel RepressJuan Jamil [name]:[id number]:[id number] DOB: 04/20/1990 DOA: 12/05/2014 PCP: No primary care provider on file.  Brief History 24 y/o transgender male presents with nausea, vomiting, confusion, and palpitations after drinking [place] tea. According to the patient, he feels that the tea was mixed improperly and felt it smelled like "paint thinner". Since drinking the tea, the patient has been spitting up some blood and complaining of odynophagia.  There is burning sensation with swallowing saliva.  The patient has been febrile throughout the hospitalization. Poison control was contacted due to concerns of serotonin syndrome (fever and tachycardia), and initially the patient was given cyproheptadine and benzodiazepines. Unfortunately, the patient continued to have fevers without clear source. He denies any previous fevers prior to admission. There is a previous history of NGU, but he states that he was tested for HIV less than 1 year ago and was negative. He denies any genital discharge or ulcerations. Gastroenterology was consulted due to odynophagia.  Assessment/Plan: FUO -While serotonin syndrome may have been in the differential diagnosis, the patient is not manifesting with typical clinical symptoms including altered mental status, neuromuscular abnormalities, and autonomic hyperactivity -Workup for infectious etiology -suspect fever related to esophageal ulceration/necrosis -Blood cultures x2 sets -UA--no pyuria -Lactic acid--1.9 -procalcitonin--0.24 -Chest x-ray negative for infiltrates -Continue to hold sertraline -HIV--neg -RPR--pending -urine NAA for GC and chlamydia--pending -remain off antimicrobials as pt is hemodynamically stable Odynophagia/Esophageal stricture/erosions -Suspect the patient ingested some other type of caustic substance (?alkali) in addition to [place]--??HSV esophagitis -Appreciate GI -12/10/2014 EGD severe stricture of midesophagus--unable to  pass pediatric scope secondary to stricture and sloughing of mucosa;  ulcerations noted in posterior pharynx;   -Patient required intubation during procedure due to concerns over airway protection and her difficulty in handling her secretions in setting of severe esophageal stenosis and ulcerations -CT chest per GI Hypokalemia -Repleted Impaired glucose tolerance -Hemoglobin A1c 5.6 MDD -Denies any suicidal ideation  Family Communication:   None at beside Disposition Plan:   Transfer to ICU-PCCM       Procedures/Studies: Dg Chest Portable 1 View  12/05/2014  CLINICAL DATA:  Drug overdose. EXAM: PORTABLE CHEST 1 VIEW COMPARISON:  None. FINDINGS: The heart size and mediastinal contours are within normal limits. Both lungs are clear. The visualized skeletal structures are unremarkable. IMPRESSION: Normal chest x-ray. Electronically Signed   By: Rudie MeyerP.  Gallerani M.D.   On: 12/05/2014 21:30         Subjective: Patient intubated and sedated. Provide any review of systems at this time   Objective: Filed Vitals:   12/10/14 1207 12/10/14 1210 12/10/14 1245 12/10/14 1259  BP:  112/73    Pulse:  92 80   Temp: 98.2 F (36.8 C)   98.3 F (36.8 C)  TempSrc:    Oral  Resp:  14 21   Height:      Weight:      SpO2:  96% 98%     Intake/Output Summary (Last 24 hours) at 12/10/14 1323 Last data filed at 12/10/14 1225  Gross per 24 hour  Intake   2100 ml  Output   1300 ml  Net    800 ml   Weight change:  Exam:   General:  Pt is alert, follows commands appropriately, not in acute distress  HEENT: No icterus, No thrush, No neck mass, [place]/AT  Cardiovascular: RRR, S1/S2, no rubs, no gallops  Respiratory: CTA bilaterally, no wheezing, no crackles, no rhonchi  Abdomen:  Soft/+BS, non tender, non distended, no guarding  Extremities: No edema, No lymphangitis, No petechiae, No rashes, no synovitis  Data Reviewed: Basic Metabolic Panel:  Recent Labs Lab 12/06/14 0245  12/07/14 0255 12/08/14 0400 12/09/14 0638 12/10/14 0602  NA 132* 136 138 137 137  K 4.2 3.6 4.0 3.4* 3.5  CL 98* 103 102 100* 102  CO2 GLUCOSE 167* 126* 100* 113* 111*  BUN 10 7 <5* <5* <5*  CREATININE 0.78 0.76 0.72 0.72 0.74  CALCIUM 8.9 8.3* 8.4* 8.8* 8.7*  MG 1.3* 1.9 1.8 1.9 1.8   Liver Function Tests:  Recent Labs Lab 12/05/14 1815 12/09/14 0724  AST 26 10*  ALT 20 13*  ALKPHOS 73 60  BILITOT 0.4 0.4  PROT 6.8 6.0*  ALBUMIN 3.8 2.8*   No results for input(s): LIPASE, AMYLASE in the last 168 hours. No results for input(s): AMMONIA in the last 168 hours. CBC:  Recent Labs Lab 12/05/14 1815 12/06/14 0245 12/07/14 0255 12/09/14 0638  WBC 17.8* 16.7* 12.7* 8.0  NEUTROABS 15.7*  --   --   --   HGB 12.9* 11.4* 11.2* 12.1*  HCT 38.6* 34.7* 34.5* 37.1*  MCV 89.6 90.1 91.5 91.4  PLT 279 256 217 277   Cardiac Enzymes:  Recent Labs Lab 12/06/14 0245  CKTOTAL 58   BNP: Invalid input(s): POCBNP CBG:  Recent Labs Lab 12/10/14 1257  GLUCAP 98    Recent Results (from the past 240 hour(s))  MRSA PCR Screening     Status: None   Collection Time: 12/06/14  2:48 AM  Result Value Ref Range Status   MRSA by PCR NEGATIVE NEGATIVE Final    Comment:        The GeneXpert MRSA Assay (FDA approved for NASAL specimens only), is one component of a comprehensive MRSA colonization surveillance program. It is not intended to diagnose MRSA infection nor to guide or monitor treatment for MRSA infections.   Urine culture     Status: None (Preliminary result)   Collection Time: 12/09/14  6:45 PM  Result Value Ref Range Status   Specimen Description URINE, CLEAN CATCH  Final   Special Requests NONE  Final   Culture TOO YOUNG TO READ  Final   Report Status PENDING  Incomplete     Scheduled Meds: . enoxaparin (LOVENOX) injection  40 mg Subcutaneous Q24H  . fentaNYL      . pantoprazole  40 mg Oral BID  . sucralfate  1 g Oral TID WC & HS    Continuous Infusions: . sodium chloride 125 mL/hr at 12/10/14 0206  . sodium chloride    . lactated ringers 125 mL/hr at 12/10/14 1013     Halah Whiteside, DO  [facility] Pager [phone]  If 7PM-7AM, please contact night-coverage [email or web address] Password [email or web address] 12/10/2014, 1:23 PM   LOS: 4 days

## 2014-12-10 NOTE — Anesthesia Postprocedure Evaluation (Signed)
  Anesthesia Post-op Note  Patient: Miguel [name]  Procedure(s) Performed: Procedure(s): ESOPHAGOGASTRODUODENOSCOPY (EGD) (N/A)  Patient Location: ICU  Anesthesia Type:General  Level of Consciousness: sedated  Airway and Oxygen Therapy: Patient placed on Ventilator (see vital sign flow sheet for setting)  Post-op Pain: none  Post-op Assessment: Post-op Vital signs reviewed, Patient's Cardiovascular Status Stable, Respiratory Function Stable and Patent Airway              Post-op Vital Signs: stable  Last Vitals:  Filed Vitals:   12/10/14 1010  BP: 119/67  Pulse: 78  Temp: 37.6 C  Resp: 18    Complications: No apparent anesthesia complications   Patient intubated due to concerns over airway protection and her difficulty in handling her secretions since she has severe esophageal stenosis, ulceration on exam due to chemical ingestion. Decision was made jointly between anesthesia and Dr. Gasper LloydNandigan with GI medicine. Would likely need time for esophageal healing prior to extubation so that she can manage her secretions and not be at risk for aspiration. Otherwise no complications from anesthesia end.

## 2014-12-10 NOTE — Op Note (Signed)
[facility] [address] [place], [id number]   ENDOSCOPY PROCEDURE REPORT  PATIENT: Miguel [name], Miguel [name]  MR#: [id number] BIRTHDATE: Jun 10, 1990 , 24  yrs. old GENDER: male ENDOSCOPIST: Marsa ArisKavitha Nandigam, MD REFERRED BY:  [facility] PROCEDURE DATE:  12/10/2014 PROCEDURE:  EGD, diagnostic ASA CLASS:     Class II INDICATIONS:  dysphagia. MEDICATIONS: Monitored anesthesia care TOPICAL ANESTHETIC: none  DESCRIPTION OF PROCEDURE: After the risks benefits and alternatives of the procedure were thoroughly explained, informed consent was obtained.  The Pentax Gastroscope [id number] and Pentax Gastroscope Peds [id number] endoscope was introduced through the mouth and advanced to the second portion of the duodenum , Without limitations.  The instrument was slowly withdrawn as the mucosa was fully examined.    Severe ulceration of esophagus was noted in proximal esophagus, switch to pediatric upper endoscope and even with that could not pass beyond proximal-mid esophagus due to stricturing and sloughing of mucosa.  Some ulceration also noted in the posterior pharynx. Retroflexion was not performed.   Patient was intubated for the procedure to maintain airway, was difficult to sedate.  The scope was then withdrawn from the patient and the procedure completed.  COMPLICATIONS: There were no immediate complications.  ENDOSCOPIC IMPRESSION: Severe ulceration of visualized esophagus  with stricturing and necrosis likely secondary to caustic ingestion (?alkali injury)  RECOMMENDATIONS: Admit to ICU intubated CT chest NPO     REPEAT EXAM: 2 weeks  eSigned:  Marsa ArisKavitha Nandigam, MD 12/10/2014 12:14 PM     PATIENT NAME:  Miguel [name], Miguel [name] MR#: [id number]

## 2014-12-10 NOTE — Consult Note (Signed)
PULMONARY / CRITICAL CARE MEDICINE   Name: Miguel [name] MRN: [id number] DOB: 1990-07-14    ADMISSION DATE:  12/05/2014 CONSULTATION DATE:  12/10/2014  REFERRING MD :  Dr. Arbutus Leas  CHIEF COMPLAINT:  N/V  INITIAL PRESENTATION: 24 year old male (transgender to male, goes by name of Miguel [name]) admitted 11/6 to [facility] for serotonin syndrome vs unknown ingestion after presenting with N/V palpitations after drinking "amazonian tea". Course complicated by esophagitis secondary to presumed caustic ingestion.  STUDIES:  11/10 > EGD - Severe ulceration of visualized esophagus with stricturing and necrosis likely secondary to caustic ingestion (?alkali injury)  SIGNIFICANT EVENTS: 11/5-6 Presented to ED for nausea/vomiting and palpitations. Admitted.  11/10 - EGD shows severe ulceration of esophagus consistent with caustic ingestion   HISTORY OF PRESENT ILLNESS:  24 year old male (transgender to male, goes by name of Miguel [name]), with past medical history of depression, substance abuse, and serotonin syndrome for which she was reportedly admitted in June of this year. States was tested for HIV one year ago and was negative. She presented 11/5 late PM complaining of nausea/vomitting as well as palpitations after drinking "amazonian tea" ([place]). Patient's partner Patient reported that the tea did not seem to be made properly and smelled like paint thinner. She was tachycardic in ED up to 140s. 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 Poison control was contacted and recommended the administration of cyproheptadine, but per [facility] documentation it is not clear whether or not she received this. She was admitted to the hosptialist team and treated as a suspected serotonin syndrome. 11/7 she began to complain on throat pain, thought due to ingestion and she was started on PPI, carafate, and PRN GI cocktail. Patient was reportedly progressing. She continued, however to complain of odynophagia and excessive secretions that she was  needing to spit into cup. No blood noted. GI was contacted and performed EGD which found severe ulceration of esophagus with stricturing and necrosis likely secondary to caustic ingestion. Patient required intubation for the procedure, and due to findings was left intubated post procedurally. To ICU, PCCM to assume care.  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  has past surgical history that includes Appendectomy (age 38). Prior to Admission medications   Medication Sig Start Date End Date Taking? Authorizing Provider  estradiol (ESTRACE) 1 MG tablet Take 2.5 mg by mouth 2 (two) times daily. 10/27/14  Yes Historical Provider, MD  sertraline (ZOLOFT) 100 MG tablet Take 100 mg by mouth daily. 11/22/14  Yes Historical Provider, MD  spironolactone (ALDACTONE) 100 MG tablet Take 100 mg by mouth 2 (two) times daily. 11/22/14  Yes Historical Provider, MD   No Known Allergies  FAMILY HISTORY:  has no family status information on file.  SOCIAL HISTORY:  reports that he has never smoked. He does not have any smokeless tobacco history on file. He reports that he drinks alcohol. He reports that he uses illicit drugs.  REVIEW OF SYSTEMS:  Unable, intubated  SUBJECTIVE:   VITAL SIGNS: Temp:  [98.2 F (36.8 C)-100 F (37.8 C)] 98.2 F (36.8 C) (11/10 1207) Pulse Rate:  [78-92] 92 (11/10 1210) Resp:  [14-18] 14 (11/10 1210) BP: (105-119)/(64-73) 112/73 mmHg (11/10 1210) SpO2:  [92 %-100 %] 96 % (11/10 1210) FiO2 (%):  [100 %] 100 % (11/10 1210) HEMODYNAMICS:   VENTILATOR SETTINGS: Vent Mode:  [-] PRVC FiO2 (%):  [100 %] 100 % Set Rate:  [  14 bmp] 14 bmp Vt Set:  [600 mL] 600 mL PEEP:  [5 cmH20] 5 cmH20 Plateau Pressure:  [15 cmH20] 15 cmH20 INTAKE / OUTPUT:  Intake/Output Summary (Last 24 hours) at 12/10/14 1251 Last data filed at 12/10/14 1225  Gross per  24 hour  Intake   2100 ml  Output   1300 ml  Net    800 ml    PHYSICAL EXAMINATION: General:  Young adult in NAD on vent Neuro:  Sedated on vent, agitation to tactile stimuli HEENT:  [place]/AT, no JVD, PERRL Cardiovascular:  RRR, no MRG Lungs:  Clear bilateral breath sounds Abdomen:  Soft, non-tender, non-distended Musculoskeletal:  No acute deformity or ROM limitation Skin:  Grossly intact  LABS:  CBC  Recent Labs Lab 12/06/14 0245 12/07/14 0255 12/09/14 0638  WBC 16.7* 12.7* 8.0  HGB 11.4* 11.2* 12.1*  HCT 34.7* 34.5* 37.1*  PLT 256 217 277   Coag's No results for input(s): APTT, INR in the last 168 hours. BMET  Recent Labs Lab 12/08/14 0400 12/09/14 0638 12/10/14 0602  NA 138 137 137  K 4.0 3.4* 3.5  CL 102 100* 102  CO2 BUN <5* <5* <5*  CREATININE 0.72 0.72 0.74  GLUCOSE 100* 113* 111*   Electrolytes  Recent Labs Lab 12/08/14 0400 12/09/14 0638 12/10/14 0602  CALCIUM 8.4* 8.8* 8.7*  MG 1.8 1.9 1.8   Sepsis Markers  Recent Labs Lab 12/09/14 1450  LATICACIDVEN 1.9  PROCALCITON 0.24   ABG No results for input(s): PHART, PCO2ART, PO2ART in the last 168 hours. Liver Enzymes  Recent Labs Lab 12/05/14 1815 12/09/14 0724  AST 26 10*  ALT 20 13*  ALKPHOS 73 60  BILITOT 0.4 0.4  ALBUMIN 3.8 2.8*   Cardiac Enzymes No results for input(s): TROPONINI, PROBNP in the last 168 hours. Glucose No results for input(s): GLUCAP in the last 168 hours.  Imaging No results found.   ASSESSMENT / PLAN:  PULMONARY OETT 11/10 > A: VDRF  P:   Full vent support Wean FiO2 as able VAP bundle CXR for ETT placement ABG Will bronchoscopically evaluate airway, if wnl with consider extubation today.   CARDIOVASCULAR A:  No acute issues  P:  Telemetry  RENAL A:   Pseudohypocalcemia (corrects to 9.7)  P:   Follow Bmet Replace electrolytes as indicated  GASTROINTESTINAL A:   Severe esophageal ulceration with strictures, thought  secondary to unknown ingestant  Odynophagia  P:   GI following PPI infusion Hold carafate NPO  HEMATOLOGIC A:   Risk for GI bleeding  P:  Monitor H&H Continue VTE ppx dose lovenox for now.  INFECTIOUS A:   No acute evidence for ingestion  P:   BCx2 11/9 >>> UC 11/9 >>> Hold off ABX Follow WBC and fever curve HIV neg RPR pending Urine NAA for GC/C pending  ENDOCRINE A:   No acute issues  P:   Follow glucose on chemistry  NEUROLOGIC A:   Acute encephalopathy secondary to medical sedation  P:   RASS goal: -1 Propofol infusion PRN fentanyl WUA   FAMILY  - Updates:   - Inter-disciplinary family meet or Palliative Care meeting due by:  11/17   Joneen Roach, AGACNP-BC [facility] Pager [phone] or [phone]  12/10/2014 1:38 PM  Attending Note:  24 year old male with no PMH significant medical history presenting to the hospital with abdominal pain and bleeding.  He was taken to EGD and evidence of chemical burn  throughout his mucosa down to the duodenum.  He evidently drank some tea that smelled like paint thinner prior.  He is sedated and lungs are clear on exam.  Bronchoscopy was done bedside to examine airway, all appears clear, I see no evidence of chemical burn in the airway.  I spoke with poison control personally, supportive care was the only recommended treatment at this time.  Extubate today and titrate O2.  Will maintain NPO given chemical burn.  Spoke with partner he is not sure what he drank.  Will wake patient up and extubate then will ask patient.  The patient is critically ill with multiple organ systems failure and requires high complexity decision making for assessment and support, frequent evaluation and titration of therapies, application of advanced monitoring technologies and extensive interpretation of multiple databases.   Critical Care Time devoted to patient care services described in this note is  90   Minutes. This time reflects time of care of this signee Dr Koren BoundWesam Titan Karner. This critical care time does not reflect procedure time, or teaching time or supervisory time of PA/NP/Med student/Med Resident etc but could involve care discussion time.  Alyson ReedyWesam G. Eyleen Rawlinson, M.D. [facility]. Pager: [phone]. After hours pager: [phone].

## 2014-12-10 NOTE — Progress Notes (Signed)
Given report to 56M;s nurse regarding pt and transfer to icu. Dr tat is also aware of the situation.

## 2014-12-10 NOTE — Care Management Note (Signed)
Case Management Note  Patient Details  Name: Miguel [name] MRN: [id number] Date of Birth: 05/06/1990  Subjective/Objective:                  Date-12-08-14 Initial Assessment Patient transferred from another unit. Spoke with patient at the bedside along with friend Miguel [name], [phone].  Introduced self as Sports coachcase manager and explained role in discharge planning and how to be reached.  [place] [facility] [place] [place].  Verified patient anticipates to go home with room mates at time of discharge.  Patient has no DME.. Expressed potential need for no other DME.  Patient gender identifies as male and states she has [employer] insurance as father is a a Emergency planning/management officerpolice officer in [place]. Patient walks or rides a bike, room mates drive, to MD appointments. Verified patient [facility] [place] [facility] [place]. Patient states she can schedule her own follow up, and is currently active with MD. Patient states she has been seen in last 6 months, and has Rx's at home from her MD.   Plan: CM will continue to follow for discharge planning and [facility] resources.   Lawerance SabalDebbie Swist RN BSN CM [phone]  12-10-14 Transferred to ICU post  EGD which shows severe ulceration of esophagus consistent with caustic ingestion Miguel [name], Miguel [name] 12/10/2014    Action/Plan:   Expected Discharge Date:                  Expected Discharge Plan:  Home/Self Care  In-House Referral:  Clinical Social Work  Discharge planning Services  CM Consult  Post Acute Care Choice:    Choice offered to:     DME Arranged:    DME Agency:     HH Arranged:    HH Agency:     Status of Service:  In process, will continue to follow  Medicare Important Message Given:    Date Medicare IM Given:    Medicare IM give by:    Date Additional Medicare IM Given:    Additional Medicare Important Message give by:     If discussed at Long Length of Stay Meetings, dates discussed:     Additional Comments:  Miguel [name], [name] Jane, RN 12/10/2014, 1:54 PM

## 2014-12-10 NOTE — Procedures (Signed)
Extubation Procedure Note  Patient Details:   Name: Miguel [name] DOB: 02/21/1990 MRN: [id number]   Airway Documentation:  Airway 7 mm (Active)  Secured at (cm) 25 cm 12/10/2014  2:00 PM  Measured From Lips 12/10/2014  2:00 PM  Secured Location Right 12/10/2014 12:10 PM  Secured By [employer] Tube Holder 12/10/2014  2:00 PM  Site Condition Dry 12/10/2014 12:10 PM     Airway 7 mm (Active)    Evaluation  O2 sats: 95 Complications: none Patient tolerated procedure well. Bilateral Breath Sounds:Rhonchi Suctioning: Airway Pt able to speak  Per CCM order, pt extubated and placed on nasal cannula.  Tolerated well, no complications.  Miguel [name], Miguel [name] 12/10/2014, 3:34 PM

## 2014-12-10 NOTE — Procedures (Signed)
Bronchoscopy Procedure Note Miguel RepressJuan [name] [id number] 08/29/1990  Procedure: Bronchoscopy Indications: Diagnostic evaluation of the airways  Procedure Details Consent: Unable to obtain consent because of altered level of consciousness. Time Out: Verified patient identification, verified procedure, site/side was marked, verified correct patient position, special equipment/implants available, medications/allergies/relevent history reviewed, required imaging and test results available.  Performed  In preparation for procedure, patient was given 100% FiO2 and bronchoscope lubricated. Sedation: Benzodiazepines and Fentanyl  Airway entered and the following bronchi were examined: RUL, RML, RLL, LUL, LLL and Bronchi.   Bronchoscope removed.  , Patient placed back on 100% FiO2 at conclusion of procedure.    Evaluation Hemodynamic Status: BP stable throughout; O2 sats: stable throughout Patient's Current Condition: stable Specimens:  None Complications: No apparent complications Patient did tolerate procedure well.  Airway appeared clear with no evidence of chemical burns, will proceed with extubation.  Miguel [name] 12/10/2014

## 2014-12-10 NOTE — Progress Notes (Signed)
[facility] History and Physical   Primary Care Physician:  No primary care provider on file.   Reason for Procedure:   Odynophagia  Plan:    EGD with possible biopsies The risks and benefits as well as alternatives of endoscopic procedure(s) have been discussed and reviewed. All questions answered. The patient agrees to proceed.      HPI: Miguel [name] is a 24 y.o. male transgender with c/o odynophagia and sore throat since admission.    Past Medical History  Diagnosis Date  . 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 Polysubstance abuse 07/2014  . Depression 07/2014.  Marland Kitchen. Serotonin syndrome 07/2014    Past Surgical History  Procedure Laterality Date  . Appendectomy  age 24    Prior to Admission medications   Medication Sig Start Date End Date Taking? Authorizing Provider  estradiol (ESTRACE) 1 MG tablet Take 2.5 mg by mouth 2 (two) times daily. 10/27/14  Yes Historical Provider, MD  sertraline (ZOLOFT) 100 MG tablet Take 100 mg by mouth daily. 11/22/14  Yes Historical Provider, MD  spironolactone (ALDACTONE) 100 MG tablet Take 100 mg by mouth 2 (two) times daily. 11/22/14  Yes Historical Provider, MD    Current Facility-Administered Medications  Medication Dose Route Frequency Provider Last Rate Last Dose  . 0.9 %  sodium chloride infusion   Intravenous Continuous Ron ParkerHarvette C Jenkins, MD 125 mL/hr at 12/10/14 0206    . 0.9 %  sodium chloride infusion   Intravenous Continuous Dianah FieldSarah J Gribbin, PA-C      . acetaminophen (TYLENOL) tablet 650 mg  650 mg Oral Q6H PRN Ron ParkerHarvette C Jenkins, MD   650 mg at 12/08/14 1555   Or  . acetaminophen (TYLENOL) suppository 650 mg  650 mg Rectal Q6H PRN Ron ParkerHarvette C Jenkins, MD      . enoxaparin (LOVENOX) injection 40 mg  40 mg Subcutaneous Q24H Ron ParkerHarvette C Jenkins, MD   40 mg at 12/09/14 0955  . gi cocktail (Maalox,Lidocaine,Donnatal)  30 mL Oral TID PRN Vassie Lollarlos Madera, MD   30 mL at 12/09/14 1610  . lactated ringers infusion    Intravenous Continuous Napoleon FormKavitha Valeda Corzine V, MD 125 mL/hr at 12/10/14 1013    . LORazepam (ATIVAN) tablet 1 mg  1 mg Oral Q8H PRN Vassie Lollarlos Madera, MD   1 mg at 12/08/14 2247  . ondansetron (ZOFRAN) tablet 4 mg  4 mg Oral Q6H PRN Ron ParkerHarvette C Jenkins, MD       Or  . ondansetron (ZOFRAN) injection 4 mg  4 mg Intravenous Q6H PRN Ron ParkerHarvette C Jenkins, MD      . oxyCODONE (Oxy IR/ROXICODONE) immediate release tablet 5 mg  5 mg Oral Q4H PRN Ron ParkerHarvette C Jenkins, MD   5 mg at 12/10/14 0618  . pantoprazole (PROTONIX) EC tablet 40 mg  40 mg Oral BID Vassie Lollarlos Madera, MD   40 mg at 12/09/14 2115  . sucralfate (CARAFATE) 1 GM/10ML suspension 1 g  1 g Oral TID WC & HS Vassie Lollarlos Madera, MD   1 g at 12/10/14 [id number]    Allergies as of 12/05/2014  . (No Known Allergies)    History reviewed. No pertinent family history.  Social History   Social History  . Marital Status: Single    Spouse Name: N/A  . Number of Children: N/A  . Years of Education: N/A   Occupational History  . Not on file.   Social History Main Topics  . Smoking status: Never Smoker   .  Smokeless tobacco: Not on file  . Alcohol Use: 0.0 oz/week    0 Standard drinks or equivalent per week     Comment: 2 glass, 2 times a week  . Drug Use: Yes     Comment: marijuana, mushrooms, benzodiazepines, very occasionally  . Sexual Activity: Not on file   Other Topics Concern  . Not on file   Social History Narrative    Review of Systems:  All other review of systems negative except as mentioned in the HPI.  Physical Exam: Vital signs in last 24 hours: Temp:  [98.7 F (37.1 C)-101.1 F (38.4 C)] 99.7 F (37.6 C) (11/10 1010) Pulse Rate:  [78-101] 78 (11/10 1010) Resp:  [16-18] 18 (11/10 1010) BP: (105-119)/(64-73) 119/67 mmHg (11/10 1010) SpO2:  [92 %-100 %] 99 % (11/10 1010) Last BM Date: 12/06/14 General:   Alert,  Well-developed, well-nourished, pleasant and cooperative in NAD Lungs:  Clear throughout to auscultation.   Heart:   Regular rate and rhythm; no murmurs, clicks, rubs,  or gallops. Abdomen:  Soft, nontender and nondistended. Normal bowel sounds.   Neuro/Psych:  Alert and cooperative. Normal mood and affect. A and O x 3   .Scherry Ran, MD [facility] [phone] (pager) 12/10/2014 11:14 AM@

## 2014-12-10 NOTE — Anesthesia Preprocedure Evaluation (Signed)
Anesthesia Evaluation  Patient identified by MRN, date of birth, ID band Patient awake    Reviewed: Allergy & Precautions, H&P , NPO status , Patient's Chart, lab work & pertinent test results  History of Anesthesia Complications Negative for: history of anesthetic complications  Airway Mallampati: II  TM Distance: >3 FB Neck ROM: full    Dental no notable dental hx.    Pulmonary neg pulmonary ROS,    Pulmonary exam normal breath sounds clear to auscultation       Cardiovascular negative cardio ROS Normal cardiovascular exam Rhythm:regular Rate:Normal     Neuro/Psych PSYCHIATRIC DISORDERS Depression Major depressive disordernegative neurological ROS     GI/Hepatic negative GI ROS, (+)     substance abuse  ,   Endo/Other  negative endocrine ROS  Renal/GU negative Renal ROS     Musculoskeletal   Abdominal   Peds  Hematology  (+) HIV,   Anesthesia Other Findings Has recently had serotonin syndrome, treated for Transgender with name now of [name]   Reproductive/Obstetrics negative OB ROS                             Anesthesia Physical Anesthesia Plan  ASA: II  Anesthesia Plan: MAC   Post-op Pain Management:    Induction: Intravenous  Airway Management Planned: Simple Face Mask  Additional Equipment:   Intra-op Plan:   Post-operative Plan:   Informed Consent: I have reviewed the patients History and Physical, chart, labs and discussed the procedure including the risks, benefits and alternatives for the proposed anesthesia with the patient or authorized representative who has indicated his/her understanding and acceptance.   Dental Advisory Given  Plan Discussed with: Anesthesiologist, CRNA and Surgeon  Anesthesia Plan Comments:         Anesthesia Quick Evaluation

## 2014-12-10 NOTE — Transfer of Care (Signed)
Immediate Anesthesia Transfer of Care Note  Patient: Miguel RepressJuan [name]  Procedure(s) Performed: Procedure(s): ESOPHAGOGASTRODUODENOSCOPY (EGD) (N/A)  Patient Location: ICU  Anesthesia Type:General  Level of Consciousness: sedated and Patient remains intubated per anesthesia plan  Airway & Oxygen Therapy: Patient remains intubated per anesthesia plan and Patient placed on Ventilator (see vital sign flow sheet for setting)  Post-op Assessment: Report given to RN and Post -op Vital signs reviewed and stable  Post vital signs: Reviewed and stable  Last Vitals:  Filed Vitals:   12/10/14 1207  BP:   Pulse:   Temp: 36.8 C  Resp:     Complications: No apparent anesthesia complications

## 2014-12-11 ENCOUNTER — Encounter (HOSPITAL_COMMUNITY): Payer: Self-pay | Admitting: Gastroenterology

## 2014-12-11 DIAGNOSIS — T5491XD Toxic effect of unspecified corrosive substance, accidental (unintentional), subsequent encounter: Secondary | ICD-10-CM

## 2014-12-11 DIAGNOSIS — S27818A Other injury of esophagus (thoracic part), initial encounter: Secondary | ICD-10-CM

## 2014-12-11 DIAGNOSIS — K2210 Ulcer of esophagus without bleeding: Secondary | ICD-10-CM

## 2014-12-11 LAB — URINE CULTURE: Culture: 60000

## 2014-12-11 LAB — CBC
HCT: 34.6 % — ABNORMAL LOW (ref 39.0–52.0)
Hemoglobin: 11.4 g/dL — ABNORMAL LOW (ref 13.0–17.0)
MCH: 29.4 pg (ref 26.0–34.0)
MCHC: 32.9 g/dL (ref 30.0–36.0)
MCV: 89.2 fL (ref 78.0–100.0)
PLATELETS: 314 10*3/uL (ref 150–400)
RBC: 3.88 MIL/uL — AB (ref 4.22–5.81)
RDW: 12.5 % (ref 11.5–15.5)
WBC: 10.7 10*3/uL — ABNORMAL HIGH (ref 4.0–10.5)

## 2014-12-11 LAB — BASIC METABOLIC PANEL
Anion gap: 12 (ref 5–15)
BUN: 8 mg/dL (ref 6–20)
CO2: 25 mmol/L (ref 22–32)
CREATININE: 0.73 mg/dL (ref 0.61–1.24)
Calcium: 8.8 mg/dL — ABNORMAL LOW (ref 8.9–10.3)
Chloride: 104 mmol/L (ref 101–111)
GFR calc Af Amer: >60 mL/min (ref >60)
GLUCOSE: 80 mg/dL (ref 65–99)
POTASSIUM: 3 mmol/L — AB (ref 3.5–5.1)
Sodium: 141 mmol/L (ref 135–145)

## 2014-12-11 LAB — MAGNESIUM: Magnesium: 1.9 mg/dL (ref 1.7–2.4)

## 2014-12-11 LAB — PHOSPHORUS: Phosphorus: 4.4 mg/dL (ref 2.5–4.6)

## 2014-12-11 LAB — MISC LABCORP TEST (SEND OUT): LABCORP TEST CODE: 791358

## 2014-12-11 LAB — PROCALCITONIN: Procalcitonin: 0.11 ng/mL

## 2014-12-11 MED ORDER — VANCOMYCIN HCL IN DEXTROSE 1-5 GM/200ML-% IV SOLN
1000.0000 mg | Freq: Three times a day (TID) | INTRAVENOUS | Status: DC
  Administered 2014-12-11 – 2014-12-18 (×21): 1000 mg via INTRAVENOUS
  Filled 2014-12-11 (×23): qty 200

## 2014-12-11 MED ORDER — POTASSIUM CHLORIDE 10 MEQ/100ML IV SOLN
10.0000 meq | INTRAVENOUS | Status: AC
  Administered 2014-12-11 (×3): 10 meq via INTRAVENOUS
  Filled 2014-12-11 (×3): qty 100

## 2014-12-11 MED ORDER — DEXTROSE 5 % IV SOLN
1.0000 g | Freq: Two times a day (BID) | INTRAVENOUS | Status: DC
  Filled 2014-12-11 (×2): qty 10

## 2014-12-11 MED ORDER — POTASSIUM CHLORIDE 10 MEQ/100ML IV SOLN
10.0000 meq | Freq: Once | INTRAVENOUS | Status: AC
  Administered 2014-12-11: 10 meq via INTRAVENOUS
  Filled 2014-12-11: qty 100

## 2014-12-11 MED ORDER — ENOXAPARIN SODIUM 40 MG/0.4ML ~~LOC~~ SOLN
40.0000 mg | SUBCUTANEOUS | Status: DC
  Administered 2014-12-11 – 2014-12-13 (×3): 40 mg via SUBCUTANEOUS
  Filled 2014-12-11 (×3): qty 0.4

## 2014-12-11 MED ORDER — CEFTRIAXONE SODIUM 2 G IJ SOLR
2.0000 g | INTRAMUSCULAR | Status: DC
  Administered 2014-12-12 – 2014-12-17 (×6): 2 g via INTRAVENOUS
  Filled 2014-12-11 (×8): qty 2

## 2014-12-11 NOTE — Progress Notes (Signed)
ANTIBIOTIC CONSULT NOTE - INITIAL  Pharmacy Consult for Vancomycin Indication: mediastinitis  No Known Allergies  Patient Measurements: Height:  (175.3 cm) Weight: 164 lb 14.5 oz (74.8 kg) IBW/kg (Calculated) : 70.7  Vital Signs: Temp: 100 F (37.8 C) (11/11 1423) Temp Source: Oral (11/11 1423) BP: 119/66 mmHg (11/11 1423) Pulse Rate: 49 (11/11 1423) Intake/Output from previous day: 11/10 0701 - 11/11 0700 In: 1567.3 [I.V.:1567.3] Out: 1775 [Urine:1775] Intake/Output from this shift: Total I/O In: 320 [I.V.:120; IV Piggyback:200] Out: 600 [Urine:600]  Labs:  Recent Labs  12/09/14 0638 12/10/14 0602 12/11/14 0455  WBC 8.0  --  10.7*  HGB 12.1*  --  11.4*  PLT 277  --  314  CREATININE 0.72 0.74 0.73   Estimated Creatinine Clearance: 141.2 mL/min (by C-G formula based on Cr of 0.73). No results for input(s): VANCOTROUGH, VANCOPEAK, VANCORANDOM, GENTTROUGH, GENTPEAK, GENTRANDOM, TOBRATROUGH, TOBRAPEAK, TOBRARND, AMIKACINPEAK, AMIKACINTROU, AMIKACIN in the last 72 hours.   Microbiology: Recent Results (from the past 720 hour(s))  MRSA PCR Screening     Status: None   Collection Time: 12/06/14  2:48 AM  Result Value Ref Range Status   MRSA by PCR NEGATIVE NEGATIVE Final    Comment:        The GeneXpert MRSA Assay (FDA approved for NASAL specimens only), is one component of a comprehensive MRSA colonization surveillance program. It is not intended to diagnose MRSA infection nor to guide or monitor treatment for MRSA infections.   Culture, blood (routine x 2)     Status: None (Preliminary result)   Collection Time: 12/09/14  2:50 PM  Result Value Ref Range Status   Specimen Description BLOOD RIGHT ANTECUBITAL  Final   Special Requests BOTTLES DRAWN AEROBIC AND ANAEROBIC 10CC   Final   Culture NO GROWTH 2 DAYS  Final   Report Status PENDING  Incomplete  Culture, blood (routine x 2)     Status: None (Preliminary result)   Collection Time: 12/09/14  3:00  PM  Result Value Ref Range Status   Specimen Description BLOOD LEFT HAND  Final   Special Requests BOTTLES DRAWN AEROBIC ONLY  10CC  Final   Culture NO GROWTH 2 DAYS  Final   Report Status PENDING  Incomplete  Urine culture     Status: None   Collection Time: 12/09/14  6:45 PM  Result Value Ref Range Status   Specimen Description URINE, CLEAN CATCH  Final   Special Requests NONE  Final   Culture 60,000 COLONIES/ml LACTOBACILLUS SPECIES  Final   Report Status 12/11/2014 FINAL  Final  MRSA PCR Screening     Status: None   Collection Time: 12/10/14  1:03 PM  Result Value Ref Range Status   MRSA by PCR NEGATIVE NEGATIVE Final    Comment:        The GeneXpert MRSA Assay (FDA approved for NASAL specimens only), is one component of a comprehensive MRSA colonization surveillance program. It is not intended to diagnose MRSA infection nor to guide or monitor treatment for MRSA infections.     Medical History: Past Medical History  Diagnosis Date  . 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 Polysubstance abuse 07/2014  . Depression 07/2014.  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 cefTRIAXone (ROCEPHIN)  IV  1 g Intravenous Q12H  . enoxaparin (LOVENOX) injection  40 mg Subcutaneous Q24H  . [START ON 12/13/2014] pantoprazole (PROTONIX) IV  40 mg Intravenous Q12H  Assessment: 24 yo transgender male > male admitted 11/6 with serotonin syndrome likely related to Zoloft and herbal "amazonian tea."  Pt reported sore throat and EGD 11/10 found to have a severe ulceration with stricture and necrosis of the esophagus likely secondary to ingestion of caustic agent.  Pt now has increased difficulty with secretions, fever, and severe esophagitis on CT scan.  To start empiric abx coverage with Vancomycin and Rocephin.  Renal function is good with CrCl > 100 ml/min.  Goal of Therapy:  Vancomycin trough level 15-20 mcg/ml  Plan:  Vancomycin 1gm IV  q8h Change Rocephin to 2gm IV q24h (ok'd with Lawson FiscalLori, [facility] PA) Continue to follow renal function, culture data, and clinical progress  Toys 'R' UsKimberly Rhona Fusilier, Pharm.D., BCPS Clinical Pharmacist Pager [phone] 12/11/2014 2:45 PM

## 2014-12-11 NOTE — Progress Notes (Signed)
[facility] Progress Note  Subjective: WBC 10.7, Hgb 11.4. 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 Had chest CT 12/10/14:IMPRESSION: 1. Severe circumferential wall thickening throughout the thoracic esophagus, most severe in the mid to lower thoracic esophagus. Fat stranding and ill-defined fluid surrounding the entire length of the thoracic esophagus. Findings are most in keeping with acute severe esophagitis. 2. Bubbly central gas in the upper thoracic esophagus could represent submucosal pneumatosis. No pneumomediastinum. No focal mediastinal fluid collection. 3. Small bilateral pleural effusions with mild bibasilar Atelectasis                                   Had a bronchoscopy that revealed normal anatomy, and pt was extubated. This morning, pt c/o burning in esophagus. Still having a lot of secretions that she is suctioning.  Objective:  Vital signs in last 24 hours: Temp:  [97.3 F (36.3 C)-100.5 F (38.1 C)] 98.7 F (37.1 C) (11/11 0822) Pulse Rate:  [70-125] 84 (11/11 0800) Resp:  [10-21] 15 (11/11 0800) BP: (89-119)/(56-95) 93/68 mmHg (11/11 0800) SpO2:  [89 %-100 %] 94 % (11/11 0800) FiO2 (%):  [30 %-100 %] 30 % (11/10 1325) Last BM Date: 12/06/14 General:   Alert,  Well-developed, in NAD Heart:  Regular rate and rhythm; no murmurs Pulm;lungs clear Abdomen:  Soft, nontender and nondistended. Normal bowel sounds, without guarding, and without rebound.   Extremities:  Without edema. Neurologic:  Alert and  oriented x4;  grossly normal neurologically. Psych:  Alert and cooperative. Normal mood and affect.   Lab Results:  Recent Labs  12/09/14 0638 12/11/14 0455  WBC 8.0 10.7*  HGB 12.1* 11.4*  HCT 37.1* 34.6*  PLT 277 314   BMET  Recent Labs  12/09/14 0638 12/10/14 0602 12/11/14 0455  NA 137 137 141  K  3.4* 3.5 3.0*  CL 100* 102 104  CO2 29 27 25   GLUCOSE 113* 111* 80  BUN <5* <5* 8  CREATININE 0.72 0.74 0.73  CALCIUM 8.8* 8.7* 8.8*     Ct Chest W Contrast  12/10/2014  CLINICAL DATA:  Ingestion of caustic substance. Severe mid esophageal stricture on upper endoscopy. EXAM: CT CHEST WITH CONTRAST TECHNIQUE: Multidetector CT imaging of the chest was performed during intravenous contrast administration. CONTRAST:  75mL OMNIPAQUE IOHEXOL 300 MG/ML  SOLN COMPARISON:  12/05/2014 chest radiograph. FINDINGS: Mediastinum/Nodes: Normal heart size. No pericardial fluid/thickening. Normal caliber thoracic aorta. Borderline dilated main pulmonary artery (3.0 cm diameter). No central pulmonary emboli. Normal visualized thyroid. There is severe circumferential wall thickening throughout the entire visualized thoracic esophagus, most severe in the mid to lower thoracic esophagus. Bubbly central gas in the upper thoracic esophagus could represent submucosal pneumatosis. No pneumomediastinum. There is fat stranding and ill-defined fluid surrounding the entire length of the thoracic esophagus, with no mediastinal fluid collection. Triangular homogeneous soft tissue in the anterior mediastinum is most in keeping with residual thymus. No pathologically enlarged axillary, mediastinal or hilar lymph nodes. Lungs/Pleura: No pneumothorax. Small layering bilateral pleural effusions. Mild passive atelectasis and dependent lower lobes. Mild platelike atelectasis in the lingula. No acute consolidative airspace disease, significant pulmonary nodules or lung masses. Upper abdomen: Unremarkable. Musculoskeletal: No aggressive appearing focal osseous lesions. Moderate symmetric gynecomastia. IMPRESSION: 1. Severe circumferential wall thickening throughout the thoracic esophagus, most severe in the mid to lower thoracic  esophagus. Fat stranding and ill-defined fluid surrounding the entire length of the thoracic esophagus. Findings are  most in keeping with acute severe esophagitis. 2. Bubbly central gas in the upper thoracic esophagus could represent submucosal pneumatosis. No pneumomediastinum. No focal mediastinal fluid collection. 3. Small bilateral pleural effusions with mild bibasilar atelectasis. Electronically Signed   By: Delbert Phenix M.D.   On: 12/10/2014 16:52   Images viewed Iva Boop, MD, [facility]   ASSESSMENT/PLAN:   24 yo with severe esophageal ulceration with strictures, odyophagia after ingestion of unknown substance.Pt still having some difficulty with secretions requiring her to use her suction frequently.Reviewed with Dr Leone Payor. Strict NPO at this time. No barium now.     LOS: 5 days   Hvozdovic, Tollie Pizza PA-C 12/11/2014, Pager [phone] Mon-Fri 8a-5p [phone] after 5p, weekends, holidays  Addendum: Reviewed with attending. Will ask CT surgery to eval as well.Pt has fever, will add antibiotic coverage.  [facility] Attending  I agree with the advanced practitioner's assessment and plan. I am concerned about persistent difficulty with secretions, fever and CT findings.Potential for deterioration, mediastinitis, other problems exists so will ask CT surgery to help with care. Start antibiotics - prophylaxis for mediastinitis. If there is any deterioration higher level of care would be needed.  Iva Boop, MD, [facility] [phone] (pager) 12/11/2014 1:38 PM

## 2014-12-11 NOTE — Evaluation (Signed)
Physical Therapy Evaluation Patient Details Name: Miguel [name] MRN: [id number] DOB: 1990/07/15 Today's Date: 12/11/2014   History of Present Illness  Patient is a 24 y/o male admitted with serotonin syndrome vs unknown ingestion after presenting with N/V and palpitations after drinking "[place] tea". Course complicated by esophagitis secondary to presumed caustic ingestion. PMH includes depression, poly substance abuse, transgender.   Clinical Impression  Patient presents with mild balance deficits secondary to hospitalization. Tolerated gait training with staggering noted but no overt LOB and balance improved with increased distance. Encouraged ambulation 3x/day with Rn. Anticipate mobility and strength will improve quickly as pt very independent and active PTA. Will follow up 1 additional time for higher level balance. Pt will be able to return home with assist from roommates.     Follow Up Recommendations No PT follow up;Supervision - Intermittent    Equipment Recommendations  None recommended by PT    Recommendations for Other Services       Precautions / Restrictions Precautions Precautions: Fall Restrictions Weight Bearing Restrictions: No      Mobility  Bed Mobility Overal bed mobility: Modified Independent                Transfers Overall transfer level: Modified independent Equipment used: None                Ambulation/Gait Ambulation/Gait assistance: Min guard Ambulation Distance (Feet): 200 Feet Assistive device: None Gait Pattern/deviations: Step-through pattern;Decreased stride length;Drifts right/left;Staggering right;Staggering left   Gait velocity interpretation: Below normal speed for age/gender General Gait Details: Staggering noted at times with a few instances of scissoring gait and unsteadiness as pt has not been up since admission but no overt LOB. This all improved with increased distance.   Stairs            Wheelchair  Mobility    Modified Rankin (Stroke Patients Only)       Balance Overall balance assessment: Needs assistance Sitting-balance support: Feet supported;No upper extremity supported Sitting balance-Leahy Scale: Good     Standing balance support: During functional activity Standing balance-Leahy Scale: Fair                               Pertinent Vitals/Pain Pain Assessment: Faces Faces Pain Scale: Hurts a little bit Pain Location: throat Pain Descriptors / Indicators: Sore Pain Intervention(s): Monitored during session;Repositioned;Premedicated before session    Home Living Family/patient expects to be discharged to:: Private residence Living Arrangements: Non-relatives/Friends Available Help at Discharge: Friend(s) Type of Home: Apartment Home Access: Level entry     Home Layout: One level Home Equipment: None      Prior Function Level of Independence: Independent               Hand Dominance        Extremity/Trunk Assessment   Upper Extremity Assessment: Defer to OT evaluation           Lower Extremity Assessment: Generalized weakness         Communication   Communication: No difficulties  Cognition Arousal/Alertness: Awake/alert Behavior During Therapy: WFL for tasks assessed/performed Overall Cognitive Status: Within Functional Limits for tasks assessed                      General Comments General comments (skin integrity, edema, etc.): Increased secretions wtih mobility.     Exercises        Assessment/Plan    PT  Assessment Patient needs continued PT services  PT Diagnosis Difficulty walking   PT Problem List Decreased strength;Decreased balance  PT Treatment Interventions Balance training;Gait training;Functional mobility training;Therapeutic activities;Therapeutic exercise;Patient/family education   PT Goals (Current goals can be found in the Care Plan section) Acute Rehab PT Goals Patient Stated Goal: to  be able to drink/eat something PT Goal Formulation: With patient Time For Goal Achievement: 12/25/14 Potential to Achieve Goals: Fair    Frequency Min 3X/week   Barriers to discharge        Co-evaluation               End of Session Equipment Utilized During Treatment: Gait belt Activity Tolerance: Patient tolerated treatment well Patient left: in bed;with call bell/phone within reach;with family/visitor present Nurse Communication: Mobility status         Time: [id number] PT Time Calculation (min) (ACUTE ONLY): 14 min   Charges:   PT Evaluation $Initial PT Evaluation Tier I: 1 Procedure     PT G Codes:        Miguel [name] A Nga Rabon 12/11/2014, 4:38 PM Mylo RedShauna Bay Wayson, PT, DPT [phone]

## 2014-12-11 NOTE — Progress Notes (Addendum)
Patient transferred to 5w17 via wheelchair .family at bedside clothing and jewelry sent with patient with electronic devices ipad and headphones to 5W and patient advised that hospital not responsible valuables I did advise patient to send jewelry home when possible

## 2014-12-11 NOTE — Consult Note (Signed)
Reason for Consult:Caustic injury to esophagus Referring Physician: Dr. Merideth [name] is an 24 y.o. male.  HPI: 24 yo transgender male with history of depression brought to ED with c/o nausea, vomiting, palpitations and confusion.   Had ingested "[place] tea" which someone gave her. Initially diagnosed with serotonin syndrome and treated with cryoheptadine.   Was given protonix, carafate and GI cocktail for esophagitis.  With failure to improve caustic injury to the esophagus was suspected. GI was consulted. 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 A CT chest showed extensive esophageal injury but no frank perforation.  Patient continues to complain of CP 4/10 at baseline, worse with certain movements. Some odynophagia, but not as bad as it was initially.   Past Medical History  Diagnosis Date  . 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 Polysubstance abuse 07/2014  . Depression 07/2014.  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 Appendectomy  age 16  . Esophagogastroduodenoscopy N/A 12/10/2014    Procedure: ESOPHAGOGASTRODUODENOSCOPY (EGD);  Surgeon: Mauri Pole, MD;  Location: [facility] ENDOSCOPY;  Service: Endoscopy;  Laterality: N/A;    History reviewed. No pertinent family history.  Social History:  reports that he has never smoked. He does not have any smokeless tobacco history on file. He reports that he drinks alcohol. He reports that he uses illicit drugs.  Allergies: No Known Allergies  Medications:  Scheduled: . [START ON 12/12/2014] cefTRIAXone (ROCEPHIN)  IV  2 g Intravenous Q24H  . enoxaparin (LOVENOX) injection  40 mg Subcutaneous Q24H  . [START ON 12/13/2014] pantoprazole (PROTONIX) IV  40 mg Intravenous Q12H  . vancomycin  1,000 mg Intravenous 3 times per day    Results for orders placed or performed during the hospital encounter of 12/05/14 (from the past 48 hour(s))   Urine culture     Status: None   Collection Time: 12/09/14  6:45 PM  Result Value Ref Range   Specimen Description URINE, CLEAN CATCH    Special Requests NONE    Culture 60,000 COLONIES/ml LACTOBACILLUS SPECIES    Report Status 12/11/2014 FINAL   Urinalysis, Routine w reflex microscopic (not at [facility])     Status: Abnormal   Collection Time: 12/09/14  6:45 PM  Result Value Ref Range   Color, Urine YELLOW YELLOW   APPearance CLEAR CLEAR   Specific Gravity, Urine 1.018 1.005 - 1.030   pH 7.0 5.0 - 8.0   Glucose, UA NEGATIVE NEGATIVE mg/dL   Hgb urine dipstick NEGATIVE NEGATIVE   Bilirubin Urine NEGATIVE NEGATIVE   Ketones, ur 15 (A) NEGATIVE mg/dL   Protein, ur NEGATIVE NEGATIVE mg/dL   Urobilinogen, UA 1.0 0.0 - 1.0 mg/dL   Nitrite NEGATIVE NEGATIVE   Leukocytes, UA NEGATIVE NEGATIVE    Comment: MICROSCOPIC NOT DONE ON URINES WITH NEGATIVE PROTEIN, BLOOD, LEUKOCYTES, NITRITE, OR GLUCOSE <1000 mg/dL.  HIV antibody     Status: None   Collection Time: 12/10/14  6:02 AM  Result Value Ref Range   HIV Screen 4th Generation wRfx Non Reactive Non Reactive    Comment: (NOTE) Performed At: [facility] [place], [place] [id number] Lindon Romp MD PJ:[id number]   RPR     Status: None   Collection Time: 12/10/14  6:02 AM  Result Value Ref Range   RPR Ser Ql Non Reactive Non Reactive    Comment: (NOTE) Performed At: [facility] [address]  [place], [place] [id number] Lindon Romp MD MO:[id number]   Basic metabolic panel     Status: Abnormal   Collection Time: 12/10/14  6:02 AM  Result Value Ref Range   Sodium 137 135 - 145 mmol/L   Potassium 3.5 3.5 - 5.1 mmol/L   Chloride 102 101 - 111 mmol/L   CO2 27 22 - 32 mmol/L   Glucose, Bld 111 (H) 65 - 99 mg/dL   BUN <5 (L) 6 - 20 mg/dL   Creatinine, Ser 0.74 0.61 - 1.24 mg/dL   Calcium 8.7 (L) 8.9 - 10.3 mg/dL   GFR calc non Af Amer >60 >60 mL/min   GFR calc Af Amer >60 >60 mL/min    Comment:  (NOTE) The eGFR has been calculated using the CKD EPI equation. This calculation has not been validated in all clinical situations. eGFR's persistently <60 mL/min signify possible Chronic Kidney Disease.    Anion gap 8 5 - 15  Magnesium     Status: None   Collection Time: 12/10/14  6:02 AM  Result Value Ref Range   Magnesium 1.8 1.7 - 2.4 mg/dL  Glucose, capillary     Status: None   Collection Time: 12/10/14 12:57 PM  Result Value Ref Range   Glucose-Capillary 98 65 - 99 mg/dL  MRSA PCR Screening     Status: None   Collection Time: 12/10/14  1:03 PM  Result Value Ref Range   MRSA by PCR NEGATIVE NEGATIVE    Comment:        The GeneXpert MRSA Assay (FDA approved for NASAL specimens only), is one component of a comprehensive MRSA colonization surveillance program. It is not intended to diagnose MRSA infection nor to guide or monitor treatment for MRSA infections.   Blood gas, arterial     Status: Abnormal   Collection Time: 12/10/14  1:30 PM  Result Value Ref Range   FIO2 1.00    Delivery systems VENTILATOR    Mode PRESSURE REGULATED VOLUME CONTROL    VT 600 mL   LHR 14 resp/min   Peep/cpap 5.0 cm H20   pH, Arterial 7.497 (H) 7.350 - 7.450   pCO2 arterial 31.1 (L) 35.0 - 45.0 mmHg   pO2, Arterial 474 (H) 80.0 - 100.0 mmHg   Bicarbonate 23.9 20.0 - 24.0 mEq/L   TCO2 24.8 0 - 100 mmol/L   Acid-Base Excess 0.9 0.0 - 2.0 mmol/L   O2 Saturation 99.9 %   Patient temperature 98.6    Collection site RIGHT RADIAL    Drawn by [id number]    Sample type ARTERIAL DRAW    Allens test (pass/fail) PASS PASS    Comment: Performed at [facility]  Triglycerides     Status: None   Collection Time: 12/10/14  2:38 PM  Result Value Ref Range   Triglycerides 122 <150 mg/dL  Miscellaneous [facility] test (send-out)     Status: None   Collection Time: 12/10/14  6:07 PM  Result Value Ref Range   [facility] test code [id number]    [facility] test name UNKNOWN SUBSTANCE ANALYSIS     Misc [facility] result COMMENT     Comment: (NOTE) Performed At: [facility] [place], [place] [id number] Lindon Romp MD HQ:[id number]   Procalcitonin     Status: None   Collection Time: 12/11/14  4:55 AM  Result Value Ref Range   Procalcitonin 0.11 ng/mL    Comment:        Interpretation: PCT (Procalcitonin) <=  0.5 ng/mL: Systemic infection (sepsis) is not likely. Local bacterial infection is possible. (NOTE)         ICU PCT Algorithm               Non ICU PCT Algorithm    ----------------------------     ------------------------------         PCT < 0.25 ng/mL                 PCT < 0.1 ng/mL     Stopping of antibiotics            Stopping of antibiotics       strongly encouraged.               strongly encouraged.    ----------------------------     ------------------------------       PCT level decrease by               PCT < 0.25 ng/mL       >= 80% from peak PCT       OR PCT 0.25 - 0.5 ng/mL          Stopping of antibiotics                                             encouraged.     Stopping of antibiotics           encouraged.    ----------------------------     ------------------------------       PCT level decrease by              PCT >= 0.25 ng/mL       < 80% from peak PCT        AND PCT >= 0.5 ng/mL            Continuin g antibiotics                                              encouraged.       Continuing antibiotics            encouraged.    ----------------------------     ------------------------------     PCT level increase compared          PCT > 0.5 ng/mL         with peak PCT AND          PCT >= 0.5 ng/mL             Escalation of antibiotics                                          strongly encouraged.      Escalation of antibiotics        strongly encouraged.   CBC     Status: Abnormal   Collection Time: 12/11/14  4:55 AM  Result Value Ref Range   WBC 10.7 (H) 4.0 - 10.5 K/uL   RBC 3.88 (L) 4.22 - 5.81 MIL/uL   Hemoglobin  11.4 (L) 13.0 - 17.0 g/dL   HCT 34.6 (L) 39.0 - 52.0 %   MCV 89.2 78.0 - 100.0 fL  MCH 29.4 26.0 - 34.0 pg   MCHC 32.9 30.0 - 36.0 g/dL   RDW 12.5 11.5 - 15.5 %   Platelets 314 150 - 400 K/uL  Basic metabolic panel     Status: Abnormal   Collection Time: 12/11/14  4:55 AM  Result Value Ref Range   Sodium 141 135 - 145 mmol/L   Potassium 3.0 (L) 3.5 - 5.1 mmol/L   Chloride 104 101 - 111 mmol/L   CO2 25 22 - 32 mmol/L   Glucose, Bld 80 65 - 99 mg/dL   BUN 8 6 - 20 mg/dL   Creatinine, Ser 0.73 0.61 - 1.24 mg/dL   Calcium 8.8 (L) 8.9 - 10.3 mg/dL   GFR calc non Af Amer >60 >60 mL/min   GFR calc Af Amer >60 >60 mL/min    Comment: (NOTE) The eGFR has been calculated using the CKD EPI equation. This calculation has not been validated in all clinical situations. eGFR's persistently <60 mL/min signify possible Chronic Kidney Disease.    Anion gap 12 5 - 15  Magnesium     Status: None   Collection Time: 12/11/14  4:55 AM  Result Value Ref Range   Magnesium 1.9 1.7 - 2.4 mg/dL  Phosphorus     Status: None   Collection Time: 12/11/14  4:55 AM  Result Value Ref Range   Phosphorus 4.4 2.5 - 4.6 mg/dL    Ct Chest W Contrast  12/10/2014  CLINICAL DATA:  Ingestion of caustic substance. Severe mid esophageal stricture on upper endoscopy. EXAM: CT CHEST WITH CONTRAST TECHNIQUE: Multidetector CT imaging of the chest was performed during intravenous contrast administration. CONTRAST:  59m OMNIPAQUE IOHEXOL 300 MG/ML  SOLN COMPARISON:  12/05/2014 chest radiograph. FINDINGS: Mediastinum/Nodes: Normal heart size. No pericardial fluid/thickening. Normal caliber thoracic aorta. Borderline dilated main pulmonary artery (3.0 cm diameter). No central pulmonary emboli. Normal visualized thyroid. There is severe circumferential wall thickening throughout the entire visualized thoracic esophagus, most severe in the mid to lower thoracic esophagus. Bubbly central gas in the upper thoracic esophagus could  represent submucosal pneumatosis. No pneumomediastinum. There is fat stranding and ill-defined fluid surrounding the entire length of the thoracic esophagus, with no mediastinal fluid collection. Triangular homogeneous soft tissue in the anterior mediastinum is most in keeping with residual thymus. No pathologically enlarged axillary, mediastinal or hilar lymph nodes. Lungs/Pleura: No pneumothorax. Small layering bilateral pleural effusions. Mild passive atelectasis and dependent lower lobes. Mild platelike atelectasis in the lingula. No acute consolidative airspace disease, significant pulmonary nodules or lung masses. Upper abdomen: Unremarkable. Musculoskeletal: No aggressive appearing focal osseous lesions. Moderate symmetric gynecomastia. IMPRESSION: 1. Severe circumferential wall thickening throughout the thoracic esophagus, most severe in the mid to lower thoracic esophagus. Fat stranding and ill-defined fluid surrounding the entire length of the thoracic esophagus. Findings are most in keeping with acute severe esophagitis. 2. Bubbly central gas in the upper thoracic esophagus could represent submucosal pneumatosis. No pneumomediastinum. No focal mediastinal fluid collection. 3. Small bilateral pleural effusions with mild bibasilar atelectasis. Electronically Signed   By: JIlona SorrelM.D.   On: 12/10/2014 16:52    Review of Systems  Constitutional: Positive for fever. Negative for chills.  Respiratory: Positive for shortness of breath.   Cardiovascular: Positive for chest pain.  Gastrointestinal: Positive for nausea and abdominal pain (mild). Negative for blood in stool and melena.       Dysphagia, odynophagia   Blood pressure 119/66, pulse 49, temperature 100 F (37.8 C), temperature source Oral,  resp. rate 19, height 5' 9"  (1.753 m), weight 164 lb 14.5 oz (74.8 kg), SpO2 95 %. Physical Exam  Vitals reviewed. Constitutional: He is oriented to person, place, and time. He appears well-developed  and well-nourished. No distress.  HENT:  Head: Normocephalic and atraumatic.  Mouth/Throat: No oropharyngeal exudate.  Eyes: Conjunctivae and EOM are normal. No scleral icterus.  Neck: Neck supple. No tracheal deviation present.  No crepitus  Cardiovascular: Normal rate, regular rhythm and normal heart sounds.   No murmur heard. Respiratory: Effort normal. No respiratory distress. He has no wheezes. He has no rales.  GI: Soft. There is no tenderness.  Musculoskeletal: He exhibits no edema.  Neurological: He is alert and oriented to person, place, and time. No cranial nerve deficit. He exhibits normal muscle tone.  Skin: Skin is warm and dry.    Assessment/Plan: 24 yo with caustic injury to esophagus 5 days ago. This is a serious problem, likely life-altering and potentially life-threatening.  There is no evidence of overt mediastinitis or perforation currently, therefore no indication for surgery. Perforation can occur as far as 2 weeks out, so she is not out of the woods yet.  Antibiotics have been started.  Strict NPO until pain resolved x 48 hours  Nutrition needs to addressed. TNA probably the best option initially, in hopes PO intake can resume in near future.  High risk for esophageal stricture formation.  Miguel [name] 12/11/2014, 5:10 PM
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
## 2014-12-12 LAB — CBC
HEMATOCRIT: 36.3 % — AB (ref 39.0–52.0)
HEMOGLOBIN: 11.9 g/dL — AB (ref 13.0–17.0)
MCH: 29.6 pg (ref 26.0–34.0)
MCHC: 32.8 g/dL (ref 30.0–36.0)
MCV: 90.3 fL (ref 78.0–100.0)
Platelets: 353 10*3/uL (ref 150–400)
RBC: 4.02 MIL/uL — ABNORMAL LOW (ref 4.22–5.81)
RDW: 12.6 % (ref 11.5–15.5)
WBC: 6.7 10*3/uL (ref 4.0–10.5)

## 2014-12-12 LAB — COMPREHENSIVE METABOLIC PANEL
ALK PHOS: 61 U/L (ref 38–126)
ALT: 16 U/L — AB (ref 17–63)
ANION GAP: 8 (ref 5–15)
AST: 17 U/L (ref 15–41)
Albumin: 2.9 g/dL — ABNORMAL LOW (ref 3.5–5.0)
BILIRUBIN TOTAL: 0.6 mg/dL (ref 0.3–1.2)
BUN: 10 mg/dL (ref 6–20)
CALCIUM: 9 mg/dL (ref 8.9–10.3)
CO2: 28 mmol/L (ref 22–32)
CREATININE: 0.73 mg/dL (ref 0.61–1.24)
Chloride: 105 mmol/L (ref 101–111)
GFR calc non Af Amer: >60 mL/min (ref >60)
GLUCOSE: 92 mg/dL (ref 65–99)
Potassium: 3.7 mmol/L (ref 3.5–5.1)
Sodium: 141 mmol/L (ref 135–145)
TOTAL PROTEIN: 6.5 g/dL (ref 6.5–8.1)

## 2014-12-12 LAB — GLUCOSE, CAPILLARY
GLUCOSE-CAPILLARY: 84 mg/dL (ref 65–99)
GLUCOSE-CAPILLARY: 115 mg/dL — AB (ref 65–99)
Glucose-Capillary: 80 mg/dL (ref 65–99)
Glucose-Capillary: 57 mg/dL — ABNORMAL LOW (ref 65–99)

## 2014-12-12 MED ORDER — FAT EMULSION 20 % IV EMUL
240.0000 mL | INTRAVENOUS | Status: AC
  Administered 2014-12-12: 240 mL via INTRAVENOUS
  Filled 2014-12-12: qty 250

## 2014-12-12 MED ORDER — INSULIN ASPART 100 UNIT/ML ~~LOC~~ SOLN: 0.0000 [IU] | Freq: Four times a day (QID) | SUBCUTANEOUS | Status: DC

## 2014-12-12 MED ORDER — SODIUM CHLORIDE 0.9 % IJ SOLN
10.0000 mL | INTRAMUSCULAR | Status: DC | PRN
  Administered 2014-12-13: 20 mL
  Administered 2014-12-13 – 2014-12-14 (×2): 10 mL
  Administered 2014-12-15: 30 mL
  Administered 2014-12-15: 20 mL
  Administered 2014-12-16: 10 mL
  Filled 2014-12-12 (×6): qty 40

## 2014-12-12 MED ORDER — M.V.I. ADULT IV INJ
INJECTION | INTRAVENOUS | Status: AC
  Administered 2014-12-12: 18:00:00 via INTRAVENOUS
  Filled 2014-12-12: qty 960

## 2014-12-12 MED ORDER — SODIUM CHLORIDE 0.9 % IV SOLN
INTRAVENOUS | Status: DC
  Administered 2014-12-12: 22:00:00 via INTRAVENOUS

## 2014-12-12 NOTE — Progress Notes (Signed)
2 Days Post-Op Procedure(s) (LRB): ESOPHAGOGASTRODUODENOSCOPY (EGD) (N/A) Subjective: Still has pain, odynophagia  Objective: Vital signs in last 24 hours: Temp:  [98.7 F (37.1 C)-100.7 F (38.2 C)] 98.7 F (37.1 C) (11/11 2120) Pulse Rate:  [49-119] 83 (11/11 2120) Cardiac Rhythm:  [-]  Resp:  [15-19] 15 (11/11 2120) BP: (109-119)/(49-66) 114/65 mmHg (11/11 2120) SpO2:  [95 %-99 %] 99 % (11/11 2120)  Hemodynamic parameters for last 24 hours:    Intake/Output from previous day: 11/11 0701 - 11/12 0700 In: 320 [I.V.:120; IV Piggyback:200] Out: 600 [Urine:600] Intake/Output this shift:    EXAM  Alert and oriented No distress  Lab Results:  Recent Labs  12/11/14 0455 12/12/14 0600  WBC 10.7* 6.7  HGB 11.4* 11.9*  HCT 34.6* 36.3*  PLT 314 353   BMET:  Recent Labs  12/11/14 0455 12/12/14 0600  NA 141 141  K 3.0* 3.7  CL 104 105  CO2 25 28  GLUCOSE 80 92  BUN 8 10  CREATININE 0.73 0.73  CALCIUM 8.8* 9.0    PT/INR: No results for input(s): LABPROT, INR in the last 72 hours. ABG    Component Value Date/Time   PHART 7.497* 12/10/2014 1330   HCO3 23.9 12/10/2014 1330   TCO2 24.8 12/10/2014 1330   O2SAT 99.9 12/10/2014 1330   CBG (last 3)   Recent Labs  12/10/14 1257  GLUCAP 98    Assessment/Plan: S/P Procedure(s) (LRB): ESOPHAGOGASTRODUODENOSCOPY (EGD) (N/A) -caustic injury to esophagus. Given CT appearance and description from endoscopy this is likely a grade III injury. Stricture formation likely It may be a long time before patient is able to take POs- needs to start TNA now. Unless there is a remarkable turn around in the next couple of days, will likely need a general surgery consult for a surgical feeding tube early next week.   LOS: 6 days    Loreli SlotSteven C Glen Blatchley 12/12/2014

## 2014-12-12 NOTE — Progress Notes (Signed)
PARENTERAL NUTRITION CONSULT NOTE - INITIAL  Pharmacy Consult for TPN Indication: intolerance to enteral feeding  No Known Allergies  Patient Measurements: Height: 5\' 9"  (175.3 cm) Weight: 164 lb 14.5 oz (74.8 kg) IBW/kg (Calculated) : 70.7  Vital Signs:   Intake/Output from previous day: 11/11 0701 - 11/12 0700 In: 320 [I.V.:120; IV Piggyback:200] Out: 600 [Urine:600] Intake/Output from this shift:    Labs:  Recent Labs  12/11/14 0455 12/12/14 0600  WBC 10.7* 6.7  HGB 11.4* 11.9*  HCT 34.6* 36.3*  PLT 314 353     Recent Labs  12/10/14 0602 12/10/14 1438 12/11/14 0455 12/12/14 0600  NA 137  --  141 141  K 3.5  --  3.0* 3.7  CL 102  --  104 105  CO2 27  --  25 28  GLUCOSE 111*  --  80 92  BUN <5*  --  8 10  CREATININE 0.74  --  0.73 0.73  CALCIUM 8.7*  --  8.8* 9.0  MG 1.8  --  1.9  --   PHOS  --   --  4.4  --   PROT  --   --   --  6.5  ALBUMIN  --   --   --  2.9*  AST  --   --   --  17  ALT  --   --   --  16*  ALKPHOS  --   --   --  61  BILITOT  --   --   --  0.6  TRIG  --  122  --   --    Estimated Creatinine Clearance: 141.2 mL/min (by C-G formula based on Cr of 0.73).    Recent Labs  12/10/14 1257  GLUCAP 98    Medical History: Past Medical History  Diagnosis Date  . 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 Polysubstance abuse 07/2014  . Depression 07/2014.  Marland Kitchen. Serotonin syndrome 07/2014    Medications:  Prescriptions prior to admission  Medication Sig Dispense Refill Last Dose  . estradiol (ESTRACE) 1 MG tablet Take 2.5 mg by mouth 2 (two) times daily.  11 12/05/2014 at Unknown time  . sertraline (ZOLOFT) 100 MG tablet Take 100 mg by mouth daily.  3 12/05/2014 at Unknown time  . spironolactone (ALDACTONE) 100 MG tablet Take 100 mg by mouth 2 (two) times daily.  3 12/05/2014 at Unknown time    Insulin Requirements in the past 24 hours:  none  Current Nutrition:  NPO  Assessment: 24 yo transgender male with caustic  injury to esophagus.  She is strict NPO.  Pharmacy consulted to start TPN for nutrition support 11/12.  GI: EGG: severe ulcerations and stricture of esophagus. CT: extensive esophageal injury. No frank perforation Endocrine: no hx DM. Renal: lytes WNL, got 4 runs of k yesterday;  has NS at 125 ml/hr Hepatic: Trig: 122 on 11/10. LFTs OK Nutrition: goals per RD -will fu 11/13; assumed well nourished PTA; Access:  PICC for TPN to be placed 11/12 TPN day: 12/12/14>>   Plan: - start TPN at 40 ml/hr plus lipids at 10 mlhr to provide 48 gm protein and 1162 cals and assess tolerance - decrease IVF to 75 ml/hr to keep total IVF 125 ml/hr or per MD orders -f/u with RD for goal - TPN labs in am - empiric q6h SSI  Herby AbrahamMichelle T. Alera Quevedo, Pharm.D. [phone] 12/12/2014 11:05 AM

## 2014-12-12 NOTE — Progress Notes (Signed)
Subjective: Still with chest pain.  Objective: Vital signs in last 24 hours: Temp:  [98.7 F (37.1 C)-100.7 F (38.2 C)] 98.7 F (37.1 C) (11/11 2120) Pulse Rate:  [49-119] 83 (11/11 2120) Resp:  [15-19] 15 (11/11 2120) BP: (109-119)/(49-66) 114/65 mmHg (11/11 2120) SpO2:  [95 %-99 %] 99 % (11/11 2120) Last BM Date: 12/11/14 (small stool--mucous and brown soft)  Intake/Output from previous day: 11/11 0701 - 11/12 0700 In: 320 [I.V.:120; IV Piggyback:200] Out: 600 [Urine:600] Intake/Output this shift:    General appearance: alert and no distress GI: soft, non-tender; bowel sounds normal; no masses,  no organomegaly  Lab Results:  Recent Labs  12/11/14 0455 12/12/14 0600  WBC 10.7* 6.7  HGB 11.4* 11.9*  HCT 34.6* 36.3*  PLT 314 353   BMET  Recent Labs  12/10/14 0602 12/11/14 0455 12/12/14 0600  NA 137 141 141  K 3.5 3.0* 3.7  CL 102 104 105  CO2 27 25 28   GLUCOSE 111* 80 92  BUN <5* 8 10  CREATININE 0.74 0.73 0.73  CALCIUM 8.7* 8.8* 9.0   LFT  Recent Labs  12/12/14 0600  PROT 6.5  ALBUMIN 2.9*  AST 17  ALT 16*  ALKPHOS 61  BILITOT 0.6   PT/INR No results for input(s): LABPROT, INR in the last 72 hours. Hepatitis Panel No results for input(s): HEPBSAG, HCVAB, HEPAIGM, HEPBIGM in the last 72 hours. C-Diff No results for input(s): CDIFFTOX in the last 72 hours. Fecal Lactopherrin No results for input(s): FECLLACTOFRN in the last 72 hours.  Studies/Results: Ct Chest W Contrast  12/10/2014  CLINICAL DATA:  Ingestion of caustic substance. Severe mid esophageal stricture on upper endoscopy. EXAM: CT CHEST WITH CONTRAST TECHNIQUE: Multidetector CT imaging of the chest was performed during intravenous contrast administration. CONTRAST:  75mL OMNIPAQUE IOHEXOL 300 MG/ML  SOLN COMPARISON:  12/05/2014 chest radiograph. FINDINGS: Mediastinum/Nodes: Normal heart size. No pericardial fluid/thickening. Normal caliber thoracic aorta. Borderline dilated main  pulmonary artery (3.0 cm diameter). No central pulmonary emboli. Normal visualized thyroid. There is severe circumferential wall thickening throughout the entire visualized thoracic esophagus, most severe in the mid to lower thoracic esophagus. Bubbly central gas in the upper thoracic esophagus could represent submucosal pneumatosis. No pneumomediastinum. There is fat stranding and ill-defined fluid surrounding the entire length of the thoracic esophagus, with no mediastinal fluid collection. Triangular homogeneous soft tissue in the anterior mediastinum is most in keeping with residual thymus. No pathologically enlarged axillary, mediastinal or hilar lymph nodes. Lungs/Pleura: No pneumothorax. Small layering bilateral pleural effusions. Mild passive atelectasis and dependent lower lobes. Mild platelike atelectasis in the lingula. No acute consolidative airspace disease, significant pulmonary nodules or lung masses. Upper abdomen: Unremarkable. Musculoskeletal: No aggressive appearing focal osseous lesions. Moderate symmetric gynecomastia. IMPRESSION: 1. Severe circumferential wall thickening throughout the thoracic esophagus, most severe in the mid to lower thoracic esophagus. Fat stranding and ill-defined fluid surrounding the entire length of the thoracic esophagus. Findings are most in keeping with acute severe esophagitis. 2. Bubbly central gas in the upper thoracic esophagus could represent submucosal pneumatosis. No pneumomediastinum. No focal mediastinal fluid collection. 3. Small bilateral pleural effusions with mild bibasilar atelectasis. Electronically Signed   By: Delbert PhenixJason A Poff M.D.   On: 12/10/2014 16:52    Medications:  Scheduled: . cefTRIAXone (ROCEPHIN)  IV  2 g Intravenous Q24H  . enoxaparin (LOVENOX) injection  40 mg Subcutaneous Q24H  . [START ON 12/13/2014] pantoprazole (PROTONIX) IV  40 mg Intravenous Q12H  . vancomycin  1,000  mg Intravenous 3 times per day   Continuous: . sodium  chloride 125 mL/hr at 12/12/14 0315  . pantoprozole (PROTONIX) infusion 8 mg/hr (12/11/14 2305)    Assessment/Plan: 1) Caustic ingestion.   She still has chest pain.  Appreciate CVTS input.  No surgical intervention at this time, but she is at risk for the next two weeks.  With the severity of her disease she is anticipated to have stricture formation.  Plan: 1) Strict NPO. 2) Continue with supportive care.   LOS: 6 days   Miguel [name] D 12/12/2014, 10:10 AM

## 2014-12-12 NOTE — Progress Notes (Signed)
17:28 CBG 56. PICC line placed and TPN began. Paged Dr. Randol KernElgergawy to inform him of CBG result and that TPN had started. RN that started TPN stated it had dextrose in it. Orders from Dr. Randol KernElgergawy to do CBG q3hr x 24 hrs. Will repeat now and then q3hr.  Midge AverVicki Kalman Nylen RN
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
## 2014-12-12 NOTE — Progress Notes (Signed)
Patient Demographics  Miguel [name], is a 24 y.o. male, DOB - 04/09/1990, ONG:[id number]  Admit date - 12/05/2014   Admitting Physician Ron ParkerHarvette C Jenkins, MD  Outpatient Primary MD for the patient is No primary care provider on file.  LOS - 6   Chief Complaint  Patient presents with  . Ingestion       Admission HPI/Brief narrative: 24 year old male (transgender to male, goes by name of [name]) brought to ED with c/o nausea, vomiting, palpitations and confusion, patient was thought to have serotonin syndrome,  Had ingested "Amazonian tea" which someone gave her, With failure to improve caustic injury to the esophagus was suspected. GI was consulted. 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 A CT chest showed extensive esophageal injury but no frank perforation. had bronchoscopy 11/10 with clear airway without evidence of chemical burns , patient has been seen by CT surgery as well .  Subjective:   Miguel [name] today has, No headache, still complains of pain, odynophagia, increased saliva secretion , fever 100.7.  Assessment & Plan    Principal Problem:   Serotonin syndrome Active Problems:   Toxic effect of other ingested (parts of) plant(s), undetermined, initial encounter   Major depressive disorder, recurrent episode (HCC)   FUO (fever of unknown origin)   Ingestion of caustic substance   Dysphagia   Odynophagia   Altered mental status   Acute on chronic respiratory failure (HCC)  SIRS - fever and leuckocytosis -  Due to caustic injury to esophagus.  caustic injury to esophagus  - EGD 11/10 showing Severe ulceration of visualized esophagus with stricturing and necrosis likely secondary to caustic ingestion  - CT consult appreciated , continue patient strict nothing by mouth  - We will start on TPN, most likely will need surgical feeding tube as per CT surgery  recommendation  -  remains high risk for perforation over the next 2 weeks, anticipated to have stricture formation - Continue with IV vancomycin and Rocephin empirically  Depression  - Can resume Zoloft when able to take by mouth    hypokalemia - Repleted  Code Status: full  Family Communication: discussed with patient   Disposition Plan: pending further workup    Procedures  - Bronchoscopy 11/10 -  EGD 11/10   Consults   PCCM GI CT surgery    Medications  Scheduled Meds: . cefTRIAXone (ROCEPHIN)  IV  2 g Intravenous Q24H  . enoxaparin (LOVENOX) injection  40 mg Subcutaneous Q24H  . [START ON 12/13/2014] insulin aspart  0-9 Units Subcutaneous 4 times per day  . [START ON 12/13/2014] pantoprazole (PROTONIX) IV  40 mg Intravenous Q12H  . vancomycin  1,000 mg Intravenous 3 times per day   Continuous Infusions: . sodium chloride    . Marland Kitchen.TPN (CLINIMIX-E) Adult     And  . fat emulsion    . pantoprozole (PROTONIX) infusion 8 mg/hr (12/12/14 1124)   PRN Meds:.[DISCONTINUED] acetaminophen **OR** acetaminophen, fentaNYL (SUBLIMAZE) injection, [DISCONTINUED] ondansetron **OR** ondansetron (ZOFRAN) IV  DVT Prophylaxis  Lovenox   Lab Results  Component Value Date   PLT 353 12/12/2014    Antibiotics    Anti-infectives    Start     Dose/Rate Route Frequency Ordered Stop  12/12/14 1400  cefTRIAXone (ROCEPHIN) 2 g in dextrose 5 % 50 mL IVPB     2 g 100 mL/hr over 30 Minutes Intravenous Every 24 hours 12/11/14 1446     12/11/14 1500  vancomycin (VANCOCIN) IVPB 1000 mg/200 mL premix     1,000 mg 200 mL/hr over 60 Minutes Intravenous 3 times per day 12/11/14 1446     12/11/14 1400  cefTRIAXone (ROCEPHIN) 1 g in dextrose 5 % 50 mL IVPB  Status:  Discontinued     1 g 100 mL/hr over 30 Minutes Intravenous Every 12 hours 12/11/14 1342 12/11/14 1446          Objective:   Filed Vitals:   12/11/14 1100 12/11/14 1123 12/11/14 1423 12/11/14 2120  BP:  109/49 119/66  114/65  Pulse: 116 119 49 83  Temp:  100.7 F (38.2 C) 100 F (37.8 C) 98.7 F (37.1 C)  TempSrc:  Oral Oral Oral  Resp: Height:      Weight:      SpO2: 99% 97% 95% 99%    Wt Readings from Last 3 Encounters:  12/07/14 74.8 kg (164 lb 14.5 oz)     Intake/Output Summary (Last 24 hours) at 12/12/14 1159 Last data filed at 12/12/14 0925  Gross per 24 hour  Intake      0 ml  Output      0 ml  Net      0 ml     Physical Exam  Awake Alert, Oriented X 3, No new F.N deficits, Normal affect [place].AT,PERRAL Supple Neck,No JVD, No cervical lymphadenopathy appriciated.  Symmetrical Chest wall movement, Good air movement bilaterally, CTAB RRR,No Gallops,Rubs or new Murmurs, No Parasternal Heave +ve B.Sounds, Abd Soft, No tenderness, No organomegaly appriciated, No rebound - guarding or rigidity. No Cyanosis, Clubbing or edema, No new Rash or bruise    Data Review   Micro Results Recent Results (from the past 240 hour(s))  MRSA PCR Screening     Status: None   Collection Time: 12/06/14  2:48 AM  Result Value Ref Range Status   MRSA by PCR NEGATIVE NEGATIVE Final    Comment:        The GeneXpert MRSA Assay (FDA approved for NASAL specimens only), is one component of a comprehensive MRSA colonization surveillance program. It is not intended to diagnose MRSA infection nor to guide or monitor treatment for MRSA infections.   Culture, blood (routine x 2)     Status: None (Preliminary result)   Collection Time: 12/09/14  2:50 PM  Result Value Ref Range Status   Specimen Description BLOOD RIGHT ANTECUBITAL  Final   Special Requests BOTTLES DRAWN AEROBIC AND ANAEROBIC 10CC   Final   Culture NO GROWTH 3 DAYS  Final   Report Status PENDING  Incomplete  Culture, blood (routine x 2)     Status: None (Preliminary result)   Collection Time: 12/09/14  3:00 PM  Result Value Ref Range Status   Specimen Description BLOOD LEFT HAND  Final   Special Requests BOTTLES DRAWN  AEROBIC ONLY  10CC  Final   Culture NO GROWTH 3 DAYS  Final   Report Status PENDING  Incomplete  Urine culture     Status: None   Collection Time: 12/09/14  6:45 PM  Result Value Ref Range Status   Specimen Description URINE, CLEAN CATCH  Final   Special Requests NONE  Final   Culture 60,000 COLONIES/ml LACTOBACILLUS SPECIES  Final  Report Status 12/11/2014 FINAL  Final  MRSA PCR Screening     Status: None   Collection Time: 12/10/14  1:03 PM  Result Value Ref Range Status   MRSA by PCR NEGATIVE NEGATIVE Final    Comment:        The GeneXpert MRSA Assay (FDA approved for NASAL specimens only), is one component of a comprehensive MRSA colonization surveillance program. It is not intended to diagnose MRSA infection nor to guide or monitor treatment for MRSA infections.     Radiology Reports Ct Chest W Contrast  12/10/2014  CLINICAL DATA:  Ingestion of caustic substance. Severe mid esophageal stricture on upper endoscopy. EXAM: CT CHEST WITH CONTRAST TECHNIQUE: Multidetector CT imaging of the chest was performed during intravenous contrast administration. CONTRAST:  75mL OMNIPAQUE IOHEXOL 300 MG/ML  SOLN COMPARISON:  12/05/2014 chest radiograph. FINDINGS: Mediastinum/Nodes: Normal heart size. No pericardial fluid/thickening. Normal caliber thoracic aorta. Borderline dilated main pulmonary artery (3.0 cm diameter). No central pulmonary emboli. Normal visualized thyroid. There is severe circumferential wall thickening throughout the entire visualized thoracic esophagus, most severe in the mid to lower thoracic esophagus. Bubbly central gas in the upper thoracic esophagus could represent submucosal pneumatosis. No pneumomediastinum. There is fat stranding and ill-defined fluid surrounding the entire length of the thoracic esophagus, with no mediastinal fluid collection. Triangular homogeneous soft tissue in the anterior mediastinum is most in keeping with residual thymus. No pathologically  enlarged axillary, mediastinal or hilar lymph nodes. Lungs/Pleura: No pneumothorax. Small layering bilateral pleural effusions. Mild passive atelectasis and dependent lower lobes. Mild platelike atelectasis in the lingula. No acute consolidative airspace disease, significant pulmonary nodules or lung masses. Upper abdomen: Unremarkable. Musculoskeletal: No aggressive appearing focal osseous lesions. Moderate symmetric gynecomastia. IMPRESSION: 1. Severe circumferential wall thickening throughout the thoracic esophagus, most severe in the mid to lower thoracic esophagus. Fat stranding and ill-defined fluid surrounding the entire length of the thoracic esophagus. Findings are most in keeping with acute severe esophagitis. 2. Bubbly central gas in the upper thoracic esophagus could represent submucosal pneumatosis. No pneumomediastinum. No focal mediastinal fluid collection. 3. Small bilateral pleural effusions with mild bibasilar atelectasis. Electronically Signed   By: Delbert Phenix M.D.   On: 12/10/2014 16:52   Dg Chest Portable 1 View  12/05/2014  CLINICAL DATA:  Drug overdose. EXAM: PORTABLE CHEST 1 VIEW COMPARISON:  None. FINDINGS: The heart size and mediastinal contours are within normal limits. Both lungs are clear. The visualized skeletal structures are unremarkable. IMPRESSION: Normal chest x-ray. Electronically Signed   By: Rudie Meyer M.D.   On: 12/05/2014 21:30     CBC  Recent Labs Lab 12/05/14 1815 12/06/14 0245 12/07/14 0255 12/09/14 0638 12/11/14 0455 12/12/14 0600  WBC 17.8* 16.7* 12.7* 8.0 10.7* 6.7  HGB 12.9* 11.4* 11.2* 12.1* 11.4* 11.9*  HCT 38.6* 34.7* 34.5* 37.1* 34.6* 36.3*  PLT 279 256 217 277 314 353  MCV 89.6 90.1 91.5 91.4 89.2 90.3  MCH 29.9 29.6 29.7 29.8 29.4 29.6  MCHC 33.4 32.9 32.5 32.6 32.9 32.8  RDW 12.5 12.9 13.2 12.6 12.5 12.6  LYMPHSABS 1.4  --   --   --   --   --   MONOABS 0.6  --   --   --   --   --   EOSABS 0.1  --   --   --   --   --   BASOSABS 0.1   --   --   --   --   --  Chemistries   Recent Labs Lab 12/05/14 1815  12/07/14 0255 12/08/14 0400 12/09/14 4098 12/09/14 0724 12/10/14 0602 12/11/14 0455 12/12/14 0600  NA 138  < > 136 138 137  --  137 141 141  K 2.9*  < > 3.6 4.0 3.4*  --  3.5 3.0* 3.7  CL 100*  < > 103 102 100*  --  102 104 105  CO2 24  < > --  GLUCOSE 189*  < > 126* 100* 113*  --  111* 80 92  BUN 16  < > 7 <5* <5*  --  <5* 8 10  CREATININE 0.89  < > 0.76 0.72 0.72  --  0.74 0.73 0.73  CALCIUM 9.0  < > 8.3* 8.4* 8.8*  --  8.7* 8.8* 9.0  MG  --   < > 1.9 1.8 1.9  --  1.8 1.9  --   AST 26  --   --   --   --  10*  --   --  17  ALT 20  --   --   --   --  13*  --   --  16*  ALKPHOS 73  --   --   --   --  60  --   --  61  BILITOT 0.4  --   --   --   --  0.4  --   --  0.6  < > = values in this interval not displayed. ------------------------------------------------------------------------------------------------------------------ estimated creatinine clearance is 141.2 mL/min (by C-G formula based on Cr of 0.73). ------------------------------------------------------------------------------------------------------------------ No results for input(s): HGBA1C in the last 72 hours. ------------------------------------------------------------------------------------------------------------------  Recent Labs  12/10/14 1438  TRIG 122   ------------------------------------------------------------------------------------------------------------------ No results for input(s): TSH, T4TOTAL, T3FREE, THYROIDAB in the last 72 hours.  Invalid input(s): FREET3 ------------------------------------------------------------------------------------------------------------------ No results for input(s): VITAMINB12, FOLATE, FERRITIN, TIBC, IRON, RETICCTPCT in the last 72 hours.  Coagulation profile No results for input(s): INR, PROTIME in the last 168 hours.  No results for input(s): DDIMER in the last  72 hours.  Cardiac Enzymes No results for input(s): CKMB, TROPONINI, MYOGLOBIN in the last 168 hours.  Invalid input(s): CK ------------------------------------------------------------------------------------------------------------------ Invalid input(s): POCBNP     Time Spent in minutes   35 minutes    Geeta Dworkin M.D on 12/12/2014 at 11:59 AM  Between 7am to 7pm - Pager - [phone]  After 7pm go to [email or web address] - password [email or web address]  [facility]   Office  [phone]

## 2014-12-12 NOTE — Progress Notes (Signed)
Peripherally Inserted Central Catheter/Midline Placement  The IV Nurse has discussed with the patient and/or persons authorized to consent for the patient, the purpose of this procedure and the potential benefits and risks involved with this procedure.  The benefits include less needle sticks, lab draws from the catheter and patient may be discharged home with the catheter.  Risks include, but not limited to, infection, bleeding, blood clot (thrombus formation), and puncture of an artery; nerve damage and irregular heat beat.  Alternatives to this procedure were also discussed.  PICC/Midline Placement Documentation        Miguel GreenlandLumban, Miguel [name] 12/12/2014, 3:25 PM

## 2014-12-13 LAB — COMPREHENSIVE METABOLIC PANEL
ALK PHOS: 49 U/L (ref 38–126)
ALT: 13 U/L — AB (ref 17–63)
AST: 13 U/L — AB (ref 15–41)
Albumin: 2.6 g/dL — ABNORMAL LOW (ref 3.5–5.0)
Anion gap: 9 (ref 5–15)
BUN: 6 mg/dL (ref 6–20)
CALCIUM: 8.6 mg/dL — AB (ref 8.9–10.3)
CO2: 26 mmol/L (ref 22–32)
CREATININE: 0.63 mg/dL (ref 0.61–1.24)
Chloride: 103 mmol/L (ref 101–111)
Glucose, Bld: 116 mg/dL — ABNORMAL HIGH (ref 65–99)
Potassium: 3.2 mmol/L — ABNORMAL LOW (ref 3.5–5.1)
Sodium: 138 mmol/L (ref 135–145)
Total Bilirubin: 0.4 mg/dL (ref 0.3–1.2)
Total Protein: 5.8 g/dL — ABNORMAL LOW (ref 6.5–8.1)

## 2014-12-13 LAB — CBC
HCT: 33 % — ABNORMAL LOW (ref 39.0–52.0)
HEMOGLOBIN: 11 g/dL — AB (ref 13.0–17.0)
MCH: 29.6 pg (ref 26.0–34.0)
MCHC: 33.3 g/dL (ref 30.0–36.0)
MCV: 88.7 fL (ref 78.0–100.0)
PLATELETS: 344 10*3/uL (ref 150–400)
RBC: 3.72 MIL/uL — AB (ref 4.22–5.81)
RDW: 12.4 % (ref 11.5–15.5)
WBC: 5.9 10*3/uL (ref 4.0–10.5)

## 2014-12-13 LAB — VANCOMYCIN, TROUGH: VANCOMYCIN TR: 10 ug/mL (ref 10.0–20.0)

## 2014-12-13 LAB — GLUCOSE, CAPILLARY
GLUCOSE-CAPILLARY: 107 mg/dL — AB (ref 65–99)
GLUCOSE-CAPILLARY: 110 mg/dL — AB (ref 65–99)
GLUCOSE-CAPILLARY: 88 mg/dL (ref 65–99)
GLUCOSE-CAPILLARY: 89 mg/dL (ref 65–99)

## 2014-12-13 LAB — MAGNESIUM: MAGNESIUM: 1.7 mg/dL (ref 1.7–2.4)

## 2014-12-13 LAB — PHOSPHORUS: PHOSPHORUS: 3.6 mg/dL (ref 2.5–4.6)

## 2014-12-13 LAB — PROCALCITONIN: Procalcitonin: <0.1 ng/mL

## 2014-12-13 LAB — PREALBUMIN: Prealbumin: 9.9 mg/dL — ABNORMAL LOW (ref 18–38)

## 2014-12-13 MED ORDER — FAT EMULSION 20 % IV EMUL
240.0000 mL | INTRAVENOUS | Status: AC
  Administered 2014-12-13: 240 mL via INTRAVENOUS
  Filled 2014-12-13: qty 250

## 2014-12-13 MED ORDER — POTASSIUM CHLORIDE 10 MEQ/50ML IV SOLN
10.0000 meq | INTRAVENOUS | Status: AC
Start: 2014-12-13 — End: 2014-12-13
  Administered 2014-12-13 (×2): 10 meq via INTRAVENOUS
  Filled 2014-12-13 (×2): qty 50

## 2014-12-13 MED ORDER — MAGNESIUM SULFATE IN D5W 10-5 MG/ML-% IV SOLN
1.0000 g | Freq: Once | INTRAVENOUS | Status: AC
  Administered 2014-12-13: 1 g via INTRAVENOUS
  Filled 2014-12-13: qty 100

## 2014-12-13 MED ORDER — TRACE MINERALS CR-CU-MN-SE-ZN 10-1000-500-60 MCG/ML IV SOLN
INTRAVENOUS | Status: AC
  Administered 2014-12-13: 18:00:00 via INTRAVENOUS
  Filled 2014-12-13: qty 2400

## 2014-12-13 MED ORDER — SODIUM CHLORIDE 0.9 % IV SOLN
INTRAVENOUS | Status: DC
  Administered 2014-12-13: 16:00:00 via INTRAVENOUS
  Administered 2014-12-16: 500 mL via INTRAVENOUS
  Administered 2014-12-18: 10 mL/h via INTRAVENOUS

## 2014-12-13 NOTE — Progress Notes (Signed)
Patient Demographics  Miguel [name], is a 23 y.o. male, DOB - 05-22-1990, ZOX:[id number]  Admit date - 12/05/2014   Admitting Physician Ron Parker, MD  Outpatient Primary MD for the patient is No primary care provider on file.  LOS - 7   Chief Complaint  Patient presents with  . Ingestion       Admission HPI/Brief narrative: 24 year old male (transgender to male, goes by name of Miguel [name]) brought to ED with c/o nausea, vomiting, palpitations and confusion, patient was thought to have serotonin syndrome,  Had ingested "[place] tea" which someone gave her, With failure to improve caustic injury to the esophagus was suspected. GI was consulted. 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 A CT chest showed extensive esophageal injury but no frank perforation. had bronchoscopy 11/10 with clear airway without evidence of chemical burns , patient has been seen by CT surgery as well .  Subjective:   Miguel [name] today has, No headache, reports pain is improving odynophagia, still having increased saliva secretion , afebrile.  Assessment & Plan    Principal Problem:   Serotonin syndrome Active Problems:   Toxic effect of other ingested (parts of) plant(s), undetermined, initial encounter   Major depressive disorder, recurrent episode (HCC)   FUO (fever of unknown origin)   Ingestion of caustic substance   Dysphagia   Odynophagia   Altered mental status   Acute on chronic respiratory failure (HCC)  SIRS - fever and leuckocytosis -  Due to caustic injury to esophagus.  caustic injury to esophagus  - EGD 11/10 showing Severe ulceration of visualized esophagus with stricturing and necrosis likely secondary to caustic ingestion  - CT consult appreciated , continue patient strict nothing by mouth  -  on TPN,  - Surgery consulted regarding need surgical feeding tube as per CT  surgery recommendation  -  remains high risk for perforation over the next 2 weeks, anticipated to have stricture formation - Continue with IV vancomycin and Rocephin empirically  Depression  - Can resume Zoloft when able to take by mouth    hypokalemia - Repleted  Code Status: full  Family Communication: discussed with patient   Disposition Plan: pending further workup    Procedures  - Bronchoscopy 11/10 -  EGD 11/10   Consults   PCCM GI CT surgery Gen. surgery   Medications  Scheduled Meds: . cefTRIAXone (ROCEPHIN)  IV  2 g Intravenous Q24H  . enoxaparin (LOVENOX) injection  40 mg Subcutaneous Q24H  . insulin aspart  0-9 Units Subcutaneous 4 times per day  . vancomycin  1,000 mg Intravenous 3 times per day   Continuous Infusions: . sodium chloride    . Marland KitchenTPN (CLINIMIX-E) Adult 40 mL/hr at 12/12/14 1748   And  . fat emulsion 240 mL (12/12/14 1748)  . Marland KitchenTPN (CLINIMIX-E) Adult     And  . fat emulsion     PRN Meds:.[DISCONTINUED] acetaminophen **OR** acetaminophen, fentaNYL (SUBLIMAZE) injection, [DISCONTINUED] ondansetron **OR** ondansetron (ZOFRAN) IV, sodium chloride  DVT Prophylaxis  Lovenox   Lab Results  Component Value Date   PLT 344 12/13/2014    Antibiotics    Anti-infectives    Start     Dose/Rate Route Frequency Ordered Stop  12/12/14 1400  cefTRIAXone (ROCEPHIN) 2 g in dextrose 5 % 50 mL IVPB     2 g 100 mL/hr over 30 Minutes Intravenous Every 24 hours 12/11/14 1446     12/11/14 1500  vancomycin (VANCOCIN) IVPB 1000 mg/200 mL premix     1,000 mg 200 mL/hr over 60 Minutes Intravenous 3 times per day 12/11/14 1446     12/11/14 1400  cefTRIAXone (ROCEPHIN) 1 g in dextrose 5 % 50 mL IVPB  Status:  Discontinued     1 g 100 mL/hr over 30 Minutes Intravenous Every 12 hours 12/11/14 1342 12/11/14 1446          Objective:   Filed Vitals:   12/12/14 1425 12/12/14 2021 12/13/14 0006 12/13/14 0425  BP: 120/75 114/62 114/70 114/75  Pulse: 72  72 72 76  Temp: 98.9 F (37.2 C) 98.4 F (36.9 C) 99 F (37.2 C) 97.7 F (36.5 C)  TempSrc: Oral Oral Oral Oral  Resp: Height:      Weight:      SpO2: 99% 98% 100% 100%    Wt Readings from Last 3 Encounters:  12/07/14 74.8 kg (164 lb 14.5 oz)     Intake/Output Summary (Last 24 hours) at 12/13/14 1517 Last data filed at 12/13/14 1047  Gross per 24 hour  Intake    400 ml  Output      0 ml  Net    400 ml     Physical Exam  Awake Alert, Oriented X 3, No new F.N deficits, Normal affect [place].AT,PERRAL Supple Neck,No JVD, No cervical lymphadenopathy appriciated.  Symmetrical Chest wall movement, Good air movement bilaterally, CTAB RRR,No Gallops,Rubs or new Murmurs, No Parasternal Heave +ve B.Sounds, Abd Soft, No tenderness, No organomegaly appriciated, No rebound - guarding or rigidity. No Cyanosis, Clubbing or edema, No new Rash or bruise    Data Review   Micro Results Recent Results (from the past 240 hour(s))  MRSA PCR Screening     Status: None   Collection Time: 12/06/14  2:48 AM  Result Value Ref Range Status   MRSA by PCR NEGATIVE NEGATIVE Final    Comment:        The GeneXpert MRSA Assay (FDA approved for NASAL specimens only), is one component of a comprehensive MRSA colonization surveillance program. It is not intended to diagnose MRSA infection nor to guide or monitor treatment for MRSA infections.   Culture, blood (routine x 2)     Status: None (Preliminary result)   Collection Time: 12/09/14  2:50 PM  Result Value Ref Range Status   Specimen Description BLOOD RIGHT ANTECUBITAL  Final   Special Requests BOTTLES DRAWN AEROBIC AND ANAEROBIC 10CC   Final   Culture NO GROWTH 4 DAYS  Final   Report Status PENDING  Incomplete  Culture, blood (routine x 2)     Status: None (Preliminary result)   Collection Time: 12/09/14  3:00 PM  Result Value Ref Range Status   Specimen Description BLOOD LEFT HAND  Final   Special Requests BOTTLES DRAWN  AEROBIC ONLY  10CC  Final   Culture NO GROWTH 4 DAYS  Final   Report Status PENDING  Incomplete  Urine culture     Status: None   Collection Time: 12/09/14  6:45 PM  Result Value Ref Range Status   Specimen Description URINE, CLEAN CATCH  Final   Special Requests NONE  Final   Culture 60,000 COLONIES/ml LACTOBACILLUS SPECIES  Final  Report Status 12/11/2014 FINAL  Final  MRSA PCR Screening     Status: None   Collection Time: 12/10/14  1:03 PM  Result Value Ref Range Status   MRSA by PCR NEGATIVE NEGATIVE Final    Comment:        The GeneXpert MRSA Assay (FDA approved for NASAL specimens only), is one component of a comprehensive MRSA colonization surveillance program. It is not intended to diagnose MRSA infection nor to guide or monitor treatment for MRSA infections.     Radiology Reports Ct Chest W Contrast  12/10/2014  CLINICAL DATA:  Ingestion of caustic substance. Severe mid esophageal stricture on upper endoscopy. EXAM: CT CHEST WITH CONTRAST TECHNIQUE: Multidetector CT imaging of the chest was performed during intravenous contrast administration. CONTRAST:  75mL OMNIPAQUE IOHEXOL 300 MG/ML  SOLN COMPARISON:  12/05/2014 chest radiograph. FINDINGS: Mediastinum/Nodes: Normal heart size. No pericardial fluid/thickening. Normal caliber thoracic aorta. Borderline dilated main pulmonary artery (3.0 cm diameter). No central pulmonary emboli. Normal visualized thyroid. There is severe circumferential wall thickening throughout the entire visualized thoracic esophagus, most severe in the mid to lower thoracic esophagus. Bubbly central gas in the upper thoracic esophagus could represent submucosal pneumatosis. No pneumomediastinum. There is fat stranding and ill-defined fluid surrounding the entire length of the thoracic esophagus, with no mediastinal fluid collection. Triangular homogeneous soft tissue in the anterior mediastinum is most in keeping with residual thymus. No pathologically  enlarged axillary, mediastinal or hilar lymph nodes. Lungs/Pleura: No pneumothorax. Small layering bilateral pleural effusions. Mild passive atelectasis and dependent lower lobes. Mild platelike atelectasis in the lingula. No acute consolidative airspace disease, significant pulmonary nodules or lung masses. Upper abdomen: Unremarkable. Musculoskeletal: No aggressive appearing focal osseous lesions. Moderate symmetric gynecomastia. IMPRESSION: 1. Severe circumferential wall thickening throughout the thoracic esophagus, most severe in the mid to lower thoracic esophagus. Fat stranding and ill-defined fluid surrounding the entire length of the thoracic esophagus. Findings are most in keeping with acute severe esophagitis. 2. Bubbly central gas in the upper thoracic esophagus could represent submucosal pneumatosis. No pneumomediastinum. No focal mediastinal fluid collection. 3. Small bilateral pleural effusions with mild bibasilar atelectasis. Electronically Signed   By: Delbert Phenix M.D.   On: 12/10/2014 16:52   Dg Chest Portable 1 View  12/05/2014  CLINICAL DATA:  Drug overdose. EXAM: PORTABLE CHEST 1 VIEW COMPARISON:  None. FINDINGS: The heart size and mediastinal contours are within normal limits. Both lungs are clear. The visualized skeletal structures are unremarkable. IMPRESSION: Normal chest x-ray. Electronically Signed   By: Rudie Meyer M.D.   On: 12/05/2014 21:30     CBC  Recent Labs Lab 12/07/14 0255 12/09/14 0638 12/11/14 0455 12/12/14 0600 12/13/14 0520  WBC 12.7* 8.0 10.7* 6.7 5.9  HGB 11.2* 12.1* 11.4* 11.9* 11.0*  HCT 34.5* 37.1* 34.6* 36.3* 33.0*  PLT 217 277 314 353 344  MCV 91.5 91.4 89.2 90.3 88.7  MCH 29.7 29.8 29.4 29.6 29.6  MCHC 32.5 32.6 32.9 32.8 33.3  RDW 13.2 12.6 12.5 12.6 12.4    Chemistries   Recent Labs Lab 12/08/14 0400 12/09/14 0638 12/09/14 0724 12/10/14 0602 12/11/14 0455 12/12/14 0600 12/13/14 0520  NA 138 137  --  137 141 141 138  K 4.0 3.4*   --  3.5 3.0* 3.7 3.2*  CL 102 100*  --  102 104 105 103  CO2 29 29  --  27 25 28 26   GLUCOSE 100* 113*  --  111* 80 92 116*  BUN <5* <  5*  --  <5* 8 10 6   CREATININE 0.72 0.72  --  0.74 0.73 0.73 0.63  CALCIUM 8.4* 8.8*  --  8.7* 8.8* 9.0 8.6*  MG 1.8 1.9  --  1.8 1.9  --  1.7  AST  --   --  10*  --   --  17 13*  ALT  --   --  13*  --   --  16* 13*  ALKPHOS  --   --  60  --   --  61 49  BILITOT  --   --  0.4  --   --  0.6 0.4   ------------------------------------------------------------------------------------------------------------------ estimated creatinine clearance is 141.2 mL/min (by C-G formula based on Cr of 0.63). ------------------------------------------------------------------------------------------------------------------ No results for input(s): HGBA1C in the last 72 hours. ------------------------------------------------------------------------------------------------------------------ No results for input(s): CHOL, HDL, LDLCALC, TRIG, CHOLHDL, LDLDIRECT in the last 72 hours. ------------------------------------------------------------------------------------------------------------------ No results for input(s): TSH, T4TOTAL, T3FREE, THYROIDAB in the last 72 hours.  Invalid input(s): FREET3 ------------------------------------------------------------------------------------------------------------------ No results for input(s): VITAMINB12, FOLATE, FERRITIN, TIBC, IRON, RETICCTPCT in the last 72 hours.  Coagulation profile No results for input(s): INR, PROTIME in the last 168 hours.  No results for input(s): DDIMER in the last 72 hours.  Cardiac Enzymes No results for input(s): CKMB, TROPONINI, MYOGLOBIN in the last 168 hours.  Invalid input(s): CK ------------------------------------------------------------------------------------------------------------------ Invalid input(s): POCBNP     Time Spent in minutes   35 minutes    ELGERGAWY, DAWOOD M.D on  12/13/2014 at 3:17 PM  Between 7am to 7pm - Pager - [phone]  After 7pm go to [email or web address] - password [email or web address]  [facility]   Office  [phone]

## 2014-12-13 NOTE — Consult Note (Signed)
Reason for Consult:esophageal injury Referring Physician: Dr. [place] Service Miguel [name] is an 24 y.o. male.  HPI: 24 yo transgender male with history of depression brought to ED with c/o nausea, vomiting, palpitations and confusion after having ingested "[place] tea" which someone gave her. Initially diagnosed with serotonin syndrome and treated with cryoheptadine per poison control.   Was given protonix, carafate and GI cocktail for esophagitis. Pt con't with pain.  GI consulted for suspected caustic injury. 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 A CT chest showed extensive esophageal injury but no frank perforation.  Pt states that pain has resolved some, and is able to swallow saliva better.   CTS and GI feel this may be a long healing process with possible strictures.  Pt currently on TNA.  Past Medical History  Diagnosis Date  . 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 Polysubstance abuse 07/2014  . Depression 07/2014.  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 Appendectomy  age 3  . Esophagogastroduodenoscopy N/A 12/10/2014    Procedure: ESOPHAGOGASTRODUODENOSCOPY (EGD);  Surgeon: Mauri Pole, MD;  Location[facility] ENDOSCOPY;  Service: Endoscopy;  Laterality: N/A;    History reviewed. No pertinent family history.  Social History:  reports that he has never smoked. He does not have any smokeless tobacco history on file. He reports that he drinks alcohol. He reports that he uses illicit drugs.  Allergies: No Known Allergies  Medications: I have reviewed the patient's current medications.  Results for orders placed or performed during the hospital encounter of 12/05/14 (from the past 48 hour(s))  CBC     Status: Abnormal   Collection Time: 12/12/14  6:00 AM  Result Value Ref Range   WBC 6.7 4.0 - 10.5 K/uL   RBC 4.02 (L) 4.22 - 5.81 MIL/uL   Hemoglobin 11.9 (L) 13.0 - 17.0 g/dL   HCT 36.3  (L) 39.0 - 52.0 %   MCV 90.3 78.0 - 100.0 fL   MCH 29.6 26.0 - 34.0 pg   MCHC 32.8 30.0 - 36.0 g/dL   RDW 12.6 11.5 - 15.5 %   Platelets 353 150 - 400 K/uL  Comprehensive metabolic panel     Status: Abnormal   Collection Time: 12/12/14  6:00 AM  Result Value Ref Range   Sodium 141 135 - 145 mmol/L   Potassium 3.7 3.5 - 5.1 mmol/L   Chloride 105 101 - 111 mmol/L   CO2 28 22 - 32 mmol/L   Glucose, Bld 92 65 - 99 mg/dL   BUN 10 6 - 20 mg/dL   Creatinine, Ser 0.73 0.61 - 1.24 mg/dL   Calcium 9.0 8.9 - 10.3 mg/dL   Total Protein 6.5 6.5 - 8.1 g/dL   Albumin 2.9 (L) 3.5 - 5.0 g/dL   AST 17 15 - 41 U/L   ALT 16 (L) 17 - 63 U/L   Alkaline Phosphatase 61 38 - 126 U/L   Total Bilirubin 0.6 0.3 - 1.2 mg/dL   GFR calc non Af Amer >60 >60 mL/min   GFR calc Af Amer >60 >60 mL/min    Comment: (NOTE) The eGFR has been calculated using the CKD EPI equation. This calculation has not been validated in all clinical situations. eGFR's persistently <60 mL/min signify possible Chronic Kidney Disease.    Anion gap 8 5 - 15  Glucose, capillary     Status: None   Collection Time: 12/12/14 11:25  AM  Result Value Ref Range   Glucose-Capillary 80 65 - 99 mg/dL  Glucose, capillary     Status: Abnormal   Collection Time: 12/12/14  5:20 PM  Result Value Ref Range   Glucose-Capillary 57 (L) 65 - 99 mg/dL  Glucose, capillary     Status: None   Collection Time: 12/12/14  6:43 PM  Result Value Ref Range   Glucose-Capillary 84 65 - 99 mg/dL  Glucose, capillary     Status: Abnormal   Collection Time: 12/12/14  8:32 PM  Result Value Ref Range   Glucose-Capillary 115 (H) 65 - 99 mg/dL   Comment 1 Notify RN    Comment 2 Document in Chart   Glucose, capillary     Status: Abnormal   Collection Time: 12/13/14 12:05 AM  Result Value Ref Range   Glucose-Capillary 107 (H) 65 - 99 mg/dL   Comment 1 Notify RN    Comment 2 Document in Chart   Procalcitonin     Status: None   Collection Time: 12/13/14  5:20 AM   Result Value Ref Range   Procalcitonin <0.10 ng/mL    Comment:        Interpretation: PCT (Procalcitonin) <= 0.5 ng/mL: Systemic infection (sepsis) is not likely. Local bacterial infection is possible. (NOTE)         ICU PCT Algorithm               Non ICU PCT Algorithm    ----------------------------     ------------------------------         PCT < 0.25 ng/mL                 PCT < 0.1 ng/mL     Stopping of antibiotics            Stopping of antibiotics       strongly encouraged.               strongly encouraged.    ----------------------------     ------------------------------       PCT level decrease by               PCT < 0.25 ng/mL       >= 80% from peak PCT       OR PCT 0.25 - 0.5 ng/mL          Stopping of antibiotics                                             encouraged.     Stopping of antibiotics           encouraged.    ----------------------------     ------------------------------       PCT level decrease by              PCT >= 0.25 ng/mL       < 80% from peak PCT        AND PCT >= 0.5 ng/mL            Continuin g antibiotics                                              encouraged.       Continuing antibiotics  encouraged.    ----------------------------     ------------------------------     PCT level increase compared          PCT > 0.5 ng/mL         with peak PCT AND          PCT >= 0.5 ng/mL             Escalation of antibiotics                                          strongly encouraged.      Escalation of antibiotics        strongly encouraged.   Comprehensive metabolic panel     Status: Abnormal   Collection Time: 12/13/14  5:20 AM  Result Value Ref Range   Sodium 138 135 - 145 mmol/L   Potassium 3.2 (L) 3.5 - 5.1 mmol/L   Chloride 103 101 - 111 mmol/L   CO2 26 22 - 32 mmol/L   Glucose, Bld 116 (H) 65 - 99 mg/dL   BUN 6 6 - 20 mg/dL   Creatinine, Ser 0.63 0.61 - 1.24 mg/dL   Calcium 8.6 (L) 8.9 - 10.3 mg/dL   Total Protein 5.8 (L) 6.5  - 8.1 g/dL   Albumin 2.6 (L) 3.5 - 5.0 g/dL   AST 13 (L) 15 - 41 U/L   ALT 13 (L) 17 - 63 U/L   Alkaline Phosphatase 49 38 - 126 U/L   Total Bilirubin 0.4 0.3 - 1.2 mg/dL   GFR calc non Af Amer >60 >60 mL/min   GFR calc Af Amer >60 >60 mL/min    Comment: (NOTE) The eGFR has been calculated using the CKD EPI equation. This calculation has not been validated in all clinical situations. eGFR's persistently <60 mL/min signify possible Chronic Kidney Disease.    Anion gap 9 5 - 15  Magnesium     Status: None   Collection Time: 12/13/14  5:20 AM  Result Value Ref Range   Magnesium 1.7 1.7 - 2.4 mg/dL  Phosphorus     Status: None   Collection Time: 12/13/14  5:20 AM  Result Value Ref Range   Phosphorus 3.6 2.5 - 4.6 mg/dL  Prealbumin     Status: Abnormal   Collection Time: 12/13/14  5:20 AM  Result Value Ref Range   Prealbumin 9.9 (L) 18 - 38 mg/dL  CBC     Status: Abnormal   Collection Time: 12/13/14  5:20 AM  Result Value Ref Range   WBC 5.9 4.0 - 10.5 K/uL   RBC 3.72 (L) 4.22 - 5.81 MIL/uL   Hemoglobin 11.0 (L) 13.0 - 17.0 g/dL   HCT 33.0 (L) 39.0 - 52.0 %   MCV 88.7 78.0 - 100.0 fL   MCH 29.6 26.0 - 34.0 pg   MCHC 33.3 30.0 - 36.0 g/dL   RDW 12.4 11.5 - 15.5 %   Platelets 344 150 - 400 K/uL  Glucose, capillary     Status: None   Collection Time: 12/13/14  6:00 AM  Result Value Ref Range   Glucose-Capillary 89 65 - 99 mg/dL   Comment 1 Notify RN    Comment 2 Document in Chart   Glucose, capillary     Status: None   Collection Time: 12/13/14 12:15 PM  Result Value Ref Range   Glucose-Capillary 88 65 - 99 mg/dL  No results found.  Review of Systems  Constitutional: Negative.   HENT: Negative.   Respiratory: Negative.   Cardiovascular: Positive for chest pain.  Gastrointestinal: Positive for nausea and vomiting. Negative for abdominal pain.  Musculoskeletal: Negative.   Skin: Negative.   Neurological: Negative.    Blood pressure 114/75, pulse 76, temperature  97.7 F (36.5 C), temperature source Oral, resp. rate 15, height 5' 9"  (1.753 m), weight 74.8 kg (164 lb 14.5 oz), SpO2 100 %. Physical Exam  Constitutional: He is oriented to person, place, and time. He appears well-developed and well-nourished.  HENT:  Head: Normocephalic and atraumatic.  Eyes: Conjunctivae and EOM are normal. Pupils are equal, round, and reactive to light.  Neck: Normal range of motion.  Cardiovascular: Normal rate, regular rhythm and normal heart sounds.   Respiratory: Effort normal and breath sounds normal.  GI: Soft. He exhibits no distension. There is no tenderness. There is no rebound and no guarding.    Musculoskeletal: Normal range of motion.  Neurological: He is alert and oriented to person, place, and time.    Assessment/Plan: 24 y/o M with caustic esophgeal injury in need for long term enteral nutrition. 1. Pt would likely benefit from J-tube, as Gastric pull-up/conduit could be be optimal in the future if esophagectomy is needed. 2. Con't NPO  3. Will D/w Dr. Grandville Silos in regards with timing, likely early this week.  Miguel Jacks., Miguel [name] 12/13/2014, 2:21 PM

## 2014-12-13 NOTE — Progress Notes (Signed)
3 Days Post-Op Procedure(s) (LRB): ESOPHAGOGASTRODUODENOSCOPY (EGD) (N/A) Subjective: Pain better Still spitting out most of saliva  Objective: Vital signs in last 24 hours: Temp:  [97.7 F (36.5 C)-99 F (37.2 C)] 97.7 F (36.5 C) (11/13 0425) Pulse Rate:  [72-76] 76 (11/13 0425) Cardiac Rhythm:  [-]  Resp:  [15-16] 15 (11/13 0425) BP: (114-120)/(62-75) 114/75 mmHg (11/13 0425) SpO2:  [98 %-100 %] 100 % (11/13 0425)  Hemodynamic parameters for last 24 hours:    Intake/Output from previous day: 11/12 0701 - 11/13 0700 In: 400 [IV Piggyback:400] Out: -  Intake/Output this shift:    General appearance: alert, cooperative and no distress Heart: regular rate and rhythm Lungs: clear to auscultation bilaterally Abdomen: normal findings: soft, non-tender  Lab Results:  Recent Labs  12/12/14 0600 12/13/14 0520  WBC 6.7 5.9  HGB 11.9* 11.0*  HCT 36.3* 33.0*  PLT 353 344   BMET:  Recent Labs  12/12/14 0600 12/13/14 0520  NA 141 138  K 3.7 3.2*  CL 105 103  CO2 28 26  GLUCOSE 92 116*  BUN 10 6  CREATININE 0.73 0.63  CALCIUM 9.0 8.6*    PT/INR: No results for input(s): LABPROT, INR in the last 72 hours. ABG    Component Value Date/Time   PHART 7.497* 12/10/2014 1330   HCO3 23.9 12/10/2014 1330   TCO2 24.8 12/10/2014 1330   O2SAT 99.9 12/10/2014 1330   CBG (last 3)   Recent Labs  12/12/14 2032 12/13/14 0005 12/13/14 0600  GLUCAP 115* 107* 89    Assessment/Plan: S/P Procedure(s) (LRB): ESOPHAGOGASTRODUODENOSCOPY (EGD) (N/A) -  Less pain today Continue with TNA, antibiotics, NPO May need enteral feeding access as this may be a prolonged course   LOS: 7 days    Loreli SlotSteven C Shawntia Mangal 12/13/2014

## 2014-12-13 NOTE — Progress Notes (Signed)
Subjective: Chest pain is improving.  Objective: Vital signs in last 24 hours: Temp:  [97.7 F (36.5 C)-99 F (37.2 C)] 97.7 F (36.5 C) (11/13 0425) Pulse Rate:  [72-76] 76 (11/13 0425) Resp:  [15-16] 15 (11/13 0425) BP: (114-120)/(62-75) 114/75 mmHg (11/13 0425) SpO2:  [98 %-100 %] 100 % (11/13 0425) Last BM Date: 12/11/14  Intake/Output from previous day: 11/12 0701 - 11/13 0700 In: 400 [IV Piggyback:400] Out: -  Intake/Output this shift:    General appearance: alert and no distress GI: soft, non-tender; bowel sounds normal; no masses,  no organomegaly  Lab Results:  Recent Labs  12/11/14 0455 12/12/14 0600 12/13/14 0520  WBC 10.7* 6.7 5.9  HGB 11.4* 11.9* 11.0*  HCT 34.6* 36.3* 33.0*  PLT 314 353 344   BMET  Recent Labs  12/11/14 0455 12/12/14 0600 12/13/14 0520  NA 141 141 138  K 3.0* 3.7 3.2*  CL 104 105 103  CO2 25 28 26   GLUCOSE 80 92 116*  BUN 8 10 6   CREATININE 0.73 0.73 0.63  CALCIUM 8.8* 9.0 8.6*   LFT  Recent Labs  12/13/14 0520  PROT 5.8*  ALBUMIN 2.6*  AST 13*  ALT 13*  ALKPHOS 49  BILITOT PENDING   PT/INR No results for input(s): LABPROT, INR in the last 72 hours. Hepatitis Panel No results for input(s): HEPBSAG, HCVAB, HEPAIGM, HEPBIGM in the last 72 hours. C-Diff No results for input(s): CDIFFTOX in the last 72 hours. Fecal Lactopherrin No results for input(s): FECLLACTOFRN in the last 72 hours.  Studies/Results: No results found.  Medications:  Scheduled: . cefTRIAXone (ROCEPHIN)  IV  2 g Intravenous Q24H  . enoxaparin (LOVENOX) injection  40 mg Subcutaneous Q24H  . insulin aspart  0-9 Units Subcutaneous 4 times per day  . magnesium sulfate 1 - 4 g bolus IVPB  1 g Intravenous Once  . potassium chloride  10 mEq Intravenous Q1 Hr x 2  . vancomycin  1,000 mg Intravenous 3 times per day   Continuous: . sodium chloride    . Marland Kitchen.TPN (CLINIMIX-E) Adult 40 mL/hr at 12/12/14 1748   And  . fat emulsion 240 mL (12/12/14  1748)  . Marland Kitchen.TPN (CLINIMIX-E) Adult     And  . fat emulsion    . pantoprozole (PROTONIX) infusion 8 mg/hr (12/12/14 1908)    Assessment/Plan: 1) Caustic esophageal injury. 2) Chest pain.   The patient has improved.  Clinically stable.  Plan: 1) Continue with supportive care.   LOS: 7 days   Lamica Mccart D 12/13/2014, 8:53 AM

## 2014-12-13 NOTE — Progress Notes (Signed)
ANTIBIOTIC CONSULT NOTE - Follow Up  Pharmacy Consult for Vancomycin Indication: mediastinitis  No Known Allergies  Patient Measurements: Height:  (175.3 cm) Weight: 164 lb 14.5 oz (74.8 kg) IBW/kg (Calculated) : 70.7  Vital Signs: Temp: 97.7 F (36.5 C) (11/13 0425) Temp Source: Oral (11/13 0425) BP: 114/75 mmHg (11/13 0425) Pulse Rate: 76 (11/13 0425) Intake/Output from previous day: 11/12 0701 - 11/13 0700 In: 400 [IV Piggyback:400] Out: -  Intake/Output from this shift:    Labs:  Recent Labs  12/11/14 0455 12/12/14 0600 12/13/14 0520  WBC 10.7* 6.7 5.9  HGB 11.4* 11.9* 11.0*  PLT 314 353 344  CREATININE 0.73 0.73 0.63   Estimated Creatinine Clearance: 141.2 mL/min (by C-G formula based on Cr of 0.63).  Recent Labs  12/13/14 1400  VANCOTROUGH 10     Microbiology: Recent Results (from the past 720 hour(s))  MRSA PCR Screening     Status: None   Collection Time: 12/06/14  2:48 AM  Result Value Ref Range Status   MRSA by PCR NEGATIVE NEGATIVE Final    Comment:        The GeneXpert MRSA Assay (FDA approved for NASAL specimens only), is one component of a comprehensive MRSA colonization surveillance program. It is not intended to diagnose MRSA infection nor to guide or monitor treatment for MRSA infections.   Culture, blood (routine x 2)     Status: None (Preliminary result)   Collection Time: 12/09/14  2:50 PM  Result Value Ref Range Status   Specimen Description BLOOD RIGHT ANTECUBITAL  Final   Special Requests BOTTLES DRAWN AEROBIC AND ANAEROBIC 10CC   Final   Culture NO GROWTH 4 DAYS  Final   Report Status PENDING  Incomplete  Culture, blood (routine x 2)     Status: None (Preliminary result)   Collection Time: 12/09/14  3:00 PM  Result Value Ref Range Status   Specimen Description BLOOD LEFT HAND  Final   Special Requests BOTTLES DRAWN AEROBIC ONLY  10CC  Final   Culture NO GROWTH 4 DAYS  Final   Report Status PENDING  Incomplete   Urine culture     Status: None   Collection Time: 12/09/14  6:45 PM  Result Value Ref Range Status   Specimen Description URINE, CLEAN CATCH  Final   Special Requests NONE  Final   Culture 60,000 COLONIES/ml LACTOBACILLUS SPECIES  Final   Report Status 12/11/2014 FINAL  Final  MRSA PCR Screening     Status: None   Collection Time: 12/10/14  1:03 PM  Result Value Ref Range Status   MRSA by PCR NEGATIVE NEGATIVE Final    Comment:        The GeneXpert MRSA Assay (FDA approved for NASAL specimens only), is one component of a comprehensive MRSA colonization surveillance program. It is not intended to diagnose MRSA infection nor to guide or monitor treatment for MRSA infections.     Medical History: Past Medical History  Diagnosis Date  . 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 Polysubstance abuse 07/2014  . Depression 07/2014.  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 cefTRIAXone (ROCEPHIN)  IV  2 g Intravenous Q24H  . enoxaparin (LOVENOX) injection  40 mg Subcutaneous Q24H  . insulin aspart  0-9 Units Subcutaneous 4 times per day  . vancomycin  1,000 mg Intravenous 3 times per day   Assessment: 24 yo transgender male > male admitted 11/6 with serotonin  syndrome likely related to Zoloft and herbal "amazonian tea."  Pt reported sore throat and EGD 11/10 found to have a severe ulceration with stricture and necrosis of the esophagus likely secondary to ingestion of caustic agent.  Pt now has increased difficulty with secretions, fever, and severe esophagitis on CT scan. To start empiric abx coverage with Vancomycin and Rocephin.  Remains afebrile, WBC wnl, 11/09 BCx NGTD. Appropriately drawn VT today of 10. Renal function steady with CrCl > 100 ml/min.  Goal of Therapy:  Vancomycin trough 10-15 mcg/mL  Plan:  Continue Vancomycin 1gm IV q8h Continue Rocephin to 2gm IV q24h Follow renal function, culture data, and clinical progress  Miguel [name]  Miguel [name], PharmD PGY1 Resident Pager [phone] 12/13/2014 3:12 PM

## 2014-12-13 NOTE — Progress Notes (Signed)
PARENTERAL NUTRITION CONSULT NOTE Pharmacy Consult for TPN Indication: intolerance to enteral feeding  No Known Allergies  Patient Measurements: Height: 5\' 9"  (175.3 cm) Weight: 164 lb 14.5 oz (74.8 kg) IBW/kg (Calculated) : 70.7  Vital Signs: Temp: 97.7 F (36.5 C) (11/13 0425) Temp Source: Oral (11/13 0425) BP: 114/75 mmHg (11/13 0425) Pulse Rate: 76 (11/13 0425) Intake/Output from previous day: 11/12 0701 - 11/13 0700 In: 400 [IV Piggyback:400] Out: -  Intake/Output from this shift:    Labs:  Recent Labs  12/11/14 0455 12/12/14 0600 12/13/14 0520  WBC 10.7* 6.7 5.9  HGB 11.4* 11.9* 11.0*  HCT 34.6* 36.3* 33.0*  PLT 314 353 344     Recent Labs  12/10/14 1438 12/11/14 0455 12/12/14 0600 12/13/14 0520  NA  --  141 141 138  K  --  3.0* 3.7 3.2*  CL  --  104 105 103  CO2  --  25 28 26   GLUCOSE  --  80 92 116*  BUN  --  8 10 6   CREATININE  --  0.73 0.73 0.63  CALCIUM  --  8.8* 9.0 8.6*  MG  --  1.9  --  1.7  PHOS  --  4.4  --  3.6  PROT  --   --  6.5 5.8*  ALBUMIN  --   --  2.9* 2.6*  AST  --   --  17 13*  ALT  --   --  16* 13*  ALKPHOS  --   --  61 49  BILITOT  --   --  0.6 PENDING  PREALBUMIN  --   --   --  9.9*  TRIG 122  --   --   --    Estimated Creatinine Clearance: 141.2 mL/min (by C-G formula based on Cr of 0.63).    Recent Labs  12/12/14 2032 12/13/14 0005 12/13/14 0600  GLUCAP 115* 107* 89    Insulin Requirements in the past 24 hours:  none  Current Nutrition:  NPO Clinimix E 5/15 at 40 ml/hr + IVFE at 10 ml/hr  Assessment: 24 yo transgender male with caustic injury to esophagus.  She is strict NPO.  Pharmacy consulted to start TPN for nutrition support 11/12.  GI: EGG: severe ulcerations and stricture of esophagus. CT: extensive esophageal injury. No frank perforation. Per TCTS " It may be a long time before patient is able to take POs- needs to start TNA now. Unless there is a remarkable turn around in the next couple of  days, will likely need a general surgery consult for a surgical feeding tube early next week" IV PPI drip ends today at 1400, will add pepcid to TPN  Endocrine: no hx DM. Had hypoglycemia prior to starting TPN, CBGs during TPN 89- 115 Renal: K 3.2, mag 1.7, phos 3.6, creat wnl,   NS at 75 ml/hr Hepatic: Trig: 122 on 11/10. LFTs OK Nutrition: goals per RD11/12; assumed well nourished PTA; Baseline  Pab = 9.9 - probably reflects inflammatory response Goals: Kcal: 2100-2300, protein 112-135 gm   Access:  PICC for TPN placed 11/12 TPN day: 12/12/14>>   Plan:  - increase TPN to 100 ml/hr plus lipids at 10 mlhr to provide 120 gm protein and 2184 kcals for 100% support - MVI and trace elements in TPN daily - decrease IVF to 10 ml/hr or per MD orders when TPN rate advanced - 1 gm mag followed by 3 runs of K for replacement - add pepcid 40  mg/day to TPN - TPN labs in am - continue empiric q6h SSI - can DC once TPN at goal  Herby Abraham, Pharm.D. [phone] 12/13/2014 7:56 AM

## 2014-12-14 ENCOUNTER — Inpatient Hospital Stay (HOSPITAL_COMMUNITY): Payer: 59

## 2014-12-14 LAB — COMPREHENSIVE METABOLIC PANEL
ALBUMIN: 2.8 g/dL — AB (ref 3.5–5.0)
ALK PHOS: 48 U/L (ref 38–126)
ALT: 11 U/L — AB (ref 17–63)
AST: 14 U/L — AB (ref 15–41)
Anion gap: 8 (ref 5–15)
BILIRUBIN TOTAL: 0.2 mg/dL — AB (ref 0.3–1.2)
BUN: 9 mg/dL (ref 6–20)
CALCIUM: 8.7 mg/dL — AB (ref 8.9–10.3)
CO2: 27 mmol/L (ref 22–32)
CREATININE: 0.57 mg/dL — AB (ref 0.61–1.24)
Chloride: 105 mmol/L (ref 101–111)
GFR calc Af Amer: >60 mL/min (ref >60)
GFR calc non Af Amer: >60 mL/min (ref >60)
GLUCOSE: 103 mg/dL — AB (ref 65–99)
Potassium: 2.9 mmol/L — ABNORMAL LOW (ref 3.5–5.1)
Sodium: 140 mmol/L (ref 135–145)
TOTAL PROTEIN: 6 g/dL — AB (ref 6.5–8.1)

## 2014-12-14 LAB — CULTURE, BLOOD (ROUTINE X 2)
Culture: NO GROWTH
Culture: NO GROWTH

## 2014-12-14 LAB — MAGNESIUM: Magnesium: 2.1 mg/dL (ref 1.7–2.4)

## 2014-12-14 LAB — GLUCOSE, CAPILLARY
GLUCOSE-CAPILLARY: 105 mg/dL — AB (ref 65–99)
GLUCOSE-CAPILLARY: 100 mg/dL — AB (ref 65–99)
GLUCOSE-CAPILLARY: 95 mg/dL (ref 65–99)
Glucose-Capillary: 106 mg/dL — ABNORMAL HIGH (ref 65–99)
Glucose-Capillary: 124 mg/dL — ABNORMAL HIGH (ref 65–99)

## 2014-12-14 LAB — PREALBUMIN: Prealbumin: 13.8 mg/dL — ABNORMAL LOW (ref 18–38)

## 2014-12-14 LAB — PHOSPHORUS: Phosphorus: 4.7 mg/dL — ABNORMAL HIGH (ref 2.5–4.6)

## 2014-12-14 LAB — TRIGLYCERIDES: Triglycerides: 100 mg/dL (ref <150)

## 2014-12-14 MED ORDER — POTASSIUM CHLORIDE 10 MEQ/50ML IV SOLN
10.0000 meq | INTRAVENOUS | Status: AC
  Administered 2014-12-14 (×4): 10 meq via INTRAVENOUS
  Filled 2014-12-14 (×4): qty 50

## 2014-12-14 MED ORDER — FAT EMULSION 20 % IV EMUL
240.0000 mL | INTRAVENOUS | Status: AC
Start: 2014-12-14 — End: 2014-12-15
  Administered 2014-12-14: 240 mL via INTRAVENOUS
  Filled 2014-12-14: qty 250

## 2014-12-14 MED ORDER — ENOXAPARIN SODIUM 40 MG/0.4ML ~~LOC~~ SOLN
40.0000 mg | SUBCUTANEOUS | Status: AC
  Administered 2014-12-14: 40 mg via SUBCUTANEOUS
  Filled 2014-12-14: qty 0.4

## 2014-12-14 MED ORDER — MAGNESIUM SULFATE IN D5W 10-5 MG/ML-% IV SOLN
1.0000 g | Freq: Once | INTRAVENOUS | Status: AC
  Administered 2014-12-14: 1 g via INTRAVENOUS
  Filled 2014-12-14: qty 100

## 2014-12-14 MED ORDER — ENOXAPARIN SODIUM 40 MG/0.4ML ~~LOC~~ SOLN
40.0000 mg | SUBCUTANEOUS | Status: DC
  Administered 2014-12-16 – 2014-12-19 (×4): 40 mg via SUBCUTANEOUS
  Filled 2014-12-14 (×4): qty 0.4

## 2014-12-14 MED ORDER — TRACE MINERALS CR-CU-MN-SE-ZN 10-1000-500-60 MCG/ML IV SOLN
INTRAVENOUS | Status: AC
  Administered 2014-12-14: 18:00:00 via INTRAVENOUS
  Filled 2014-12-14: qty 2400

## 2014-12-14 NOTE — Progress Notes (Signed)
Patient Demographics  Miguel [name], is a 24 y.o. male, DOB - July 29, 1990, ZOX:[id number]  Admit date - 12/05/2014   Admitting Physician Ron Parker, MD  Outpatient Primary MD for the patient is No primary care provider on file.  LOS - 8   Chief Complaint  Patient presents with  . Ingestion       Admission HPI/Brief narrative: 24 year old male (transgender to male, goes by name of Miguel [name]) brought to ED with c/o nausea, vomiting, palpitations and confusion, patient was thought to have serotonin syndrome,  Had ingested "Amazonian tea" which someone gave her, With failure to improve caustic injury to the esophagus was suspected. GI was consulted. 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 A CT chest showed extensive esophageal injury but no frank perforation. had bronchoscopy 11/10 with clear airway without evidence of chemical burns , patient has been seen by CT surgery as well . Started on empiric coverage of vancomycin and Rocephin secondary to fever, surgery has been consulted regarding surgical feeding tube.  Subjective:   Miguel [name] today has, No headache, reports pain is improving odynophagia, reports pain is improving,  patient is able to swallow saliva  Assessment & Plan    Principal Problem:   Serotonin syndrome Active Problems:   Toxic effect of other ingested (parts of) plant(s), undetermined, initial encounter   Major depressive disorder, recurrent episode (HCC)   FUO (fever of unknown origin)   Ingestion of caustic substance   Dysphagia   Odynophagia   Altered mental status   Acute on chronic respiratory failure (HCC)  SIRS -Afebrile, no leukocytosis -  Due to caustic injury to esophagus.  caustic injury to esophagus  - EGD 11/10 showing Severe ulceration of visualized esophagus with stricturing and necrosis likely secondary to caustic ingestion  - CT  consult appreciated , continue patient strict nothing by mouth  -  on TPN  - Surgery consulted regarding need surgical feeding tube as per CT surgery recommendation  -  remains high risk for perforation over the next 2 weeks, anticipated to have stricture formation - Continue with IV vancomycin and Rocephin empirically  Depression  - Can resume Zoloft when able to take by mouth    hypokalemia - Managed by pharmacy as part of TPN management  Code Status: full  Family Communication: discussed with patient and friend at bedside  Disposition Plan: pending further workup    Procedures  - Bronchoscopy 11/10 -  EGD 11/10   Consults   PCCM GI CT surgery Gen. surgery   Medications  Scheduled Meds: . cefTRIAXone (ROCEPHIN)  IV  2 g Intravenous Q24H  . [START ON 12/16/2014] enoxaparin (LOVENOX) injection  40 mg Subcutaneous Q24H  . insulin aspart  0-9 Units Subcutaneous 4 times per day  . potassium chloride  10 mEq Intravenous Q1 Hr x 4  . vancomycin  1,000 mg Intravenous 3 times per day   Continuous Infusions: . sodium chloride 10 mL/hr at 12/13/14 1600  . Marland KitchenTPN (CLINIMIX-E) Adult 100 mL/hr at 12/13/14 1737   And  . fat emulsion 240 mL (12/13/14 1737)  . Marland KitchenTPN (CLINIMIX-E) Adult     And  . fat emulsion     PRN Meds:.[DISCONTINUED] acetaminophen **OR** acetaminophen,  fentaNYL (SUBLIMAZE) injection, [DISCONTINUED] ondansetron **OR** ondansetron (ZOFRAN) IV, sodium chloride  DVT Prophylaxis  Lovenox   Lab Results  Component Value Date   PLT 344 12/13/2014    Antibiotics    Anti-infectives    Start     Dose/Rate Route Frequency Ordered Stop   12/12/14 1400  cefTRIAXone (ROCEPHIN) 2 g in dextrose 5 % 50 mL IVPB     2 g 100 mL/hr over 30 Minutes Intravenous Every 24 hours 12/11/14 1446     12/11/14 1500  vancomycin (VANCOCIN) IVPB 1000 mg/200 mL premix     1,000 mg 200 mL/hr over 60 Minutes Intravenous 3 times per day 12/11/14 1446     12/11/14 1400  cefTRIAXone  (ROCEPHIN) 1 g in dextrose 5 % 50 mL IVPB  Status:  Discontinued     1 g 100 mL/hr over 30 Minutes Intravenous Every 12 hours 12/11/14 1342 12/11/14 1446          Objective:   Filed Vitals:   12/13/14 2048 12/14/14 0050 12/14/14 0411 12/14/14 1434  BP: 103/62 95/56 96/52  99/55  Pulse: 85 71 48 68  Temp: 98.9 F (37.2 C) 98.5 F (36.9 C) 98.2 F (36.8 C) 98.8 F (37.1 C)  TempSrc: Oral Oral Oral Oral  Resp: 16  15 18   Height:      Weight:      SpO2: 91% 99% 92% 99%    Wt Readings from Last 3 Encounters:  12/07/14 74.8 kg (164 lb 14.5 oz)     Intake/Output Summary (Last 24 hours) at 12/14/14 1446 Last data filed at 12/14/14 0900  Gross per 24 hour  Intake 2132.16 ml  Output      0 ml  Net 2132.16 ml     Physical Exam  Awake Alert, Oriented X 3, No new F.N deficits, Normal affect [place].AT,PERRAL Supple Neck,No JVD, No cervical lymphadenopathy appriciated.  Symmetrical Chest wall movement, Good air movement bilaterally, CTAB RRR,No Gallops,Rubs or new Murmurs, No Parasternal Heave +ve B.Sounds, Abd Soft, No tenderness, No organomegaly appriciated, No rebound - guarding or rigidity. No Cyanosis, Clubbing or edema, No new Rash or bruise    Data Review   Micro Results Recent Results (from the past 240 hour(s))  MRSA PCR Screening     Status: None   Collection Time: 12/06/14  2:48 AM  Result Value Ref Range Status   MRSA by PCR NEGATIVE NEGATIVE Final    Comment:        The GeneXpert MRSA Assay (FDA approved for NASAL specimens only), is one component of a comprehensive MRSA colonization surveillance program. It is not intended to diagnose MRSA infection nor to guide or monitor treatment for MRSA infections.   Culture, blood (routine x 2)     Status: None   Collection Time: 12/09/14  2:50 PM  Result Value Ref Range Status   Specimen Description BLOOD RIGHT ANTECUBITAL  Final   Special Requests BOTTLES DRAWN AEROBIC AND ANAEROBIC 10CC   Final   Culture  NO GROWTH 5 DAYS  Final   Report Status 12/14/2014 FINAL  Final  Culture, blood (routine x 2)     Status: None   Collection Time: 12/09/14  3:00 PM  Result Value Ref Range Status   Specimen Description BLOOD LEFT HAND  Final   Special Requests BOTTLES DRAWN AEROBIC ONLY  10CC  Final   Culture NO GROWTH 5 DAYS  Final   Report Status 12/14/2014 FINAL  Final  Urine culture  Status: None   Collection Time: 12/09/14  6:45 PM  Result Value Ref Range Status   Specimen Description URINE, CLEAN CATCH  Final   Special Requests NONE  Final   Culture 60,000 COLONIES/ml LACTOBACILLUS SPECIES  Final   Report Status 12/11/2014 FINAL  Final  MRSA PCR Screening     Status: None   Collection Time: 12/10/14  1:03 PM  Result Value Ref Range Status   MRSA by PCR NEGATIVE NEGATIVE Final    Comment:        The GeneXpert MRSA Assay (FDA approved for NASAL specimens only), is one component of a comprehensive MRSA colonization surveillance program. It is not intended to diagnose MRSA infection nor to guide or monitor treatment for MRSA infections.     Radiology Reports Ct Chest W Contrast  12/10/2014  CLINICAL DATA:  Ingestion of caustic substance. Severe mid esophageal stricture on upper endoscopy. EXAM: CT CHEST WITH CONTRAST TECHNIQUE: Multidetector CT imaging of the chest was performed during intravenous contrast administration. CONTRAST:  75mL OMNIPAQUE IOHEXOL 300 MG/ML  SOLN COMPARISON:  12/05/2014 chest radiograph. FINDINGS: Mediastinum/Nodes: Normal heart size. No pericardial fluid/thickening. Normal caliber thoracic aorta. Borderline dilated main pulmonary artery (3.0 cm diameter). No central pulmonary emboli. Normal visualized thyroid. There is severe circumferential wall thickening throughout the entire visualized thoracic esophagus, most severe in the mid to lower thoracic esophagus. Bubbly central gas in the upper thoracic esophagus could represent submucosal pneumatosis. No  pneumomediastinum. There is fat stranding and ill-defined fluid surrounding the entire length of the thoracic esophagus, with no mediastinal fluid collection. Triangular homogeneous soft tissue in the anterior mediastinum is most in keeping with residual thymus. No pathologically enlarged axillary, mediastinal or hilar lymph nodes. Lungs/Pleura: No pneumothorax. Small layering bilateral pleural effusions. Mild passive atelectasis and dependent lower lobes. Mild platelike atelectasis in the lingula. No acute consolidative airspace disease, significant pulmonary nodules or lung masses. Upper abdomen: Unremarkable. Musculoskeletal: No aggressive appearing focal osseous lesions. Moderate symmetric gynecomastia. IMPRESSION: 1. Severe circumferential wall thickening throughout the thoracic esophagus, most severe in the mid to lower thoracic esophagus. Fat stranding and ill-defined fluid surrounding the entire length of the thoracic esophagus. Findings are most in keeping with acute severe esophagitis. 2. Bubbly central gas in the upper thoracic esophagus could represent submucosal pneumatosis. No pneumomediastinum. No focal mediastinal fluid collection. 3. Small bilateral pleural effusions with mild bibasilar atelectasis. Electronically Signed   By: Delbert Phenix M.D.   On: 12/10/2014 16:52   Dg Chest Portable 1 View  12/05/2014  CLINICAL DATA:  Drug overdose. EXAM: PORTABLE CHEST 1 VIEW COMPARISON:  None. FINDINGS: The heart size and mediastinal contours are within normal limits. Both lungs are clear. The visualized skeletal structures are unremarkable. IMPRESSION: Normal chest x-ray. Electronically Signed   By: Rudie Meyer M.D.   On: 12/05/2014 21:30     CBC  Recent Labs Lab 12/09/14 0638 12/11/14 0455 12/12/14 0600 12/13/14 0520  WBC 8.0 10.7* 6.7 5.9  HGB 12.1* 11.4* 11.9* 11.0*  HCT 37.1* 34.6* 36.3* 33.0*  PLT 277 314 353 344  MCV 91.4 89.2 90.3 88.7  MCH 29.8 29.4 29.6 29.6  MCHC 32.6 32.9 32.8  33.3  RDW 12.6 12.5 12.6 12.4    Chemistries   Recent Labs Lab 12/09/14 0638 12/09/14 0724 12/10/14 0602 12/11/14 0455 12/12/14 0600 12/13/14 0520 12/14/14 0525  NA 137  --  137 141 141 138 140  K 3.4*  --  3.5 3.0* 3.7 3.2* 2.9*  CL 100*  --  102 104 105 103 105  CO2 29  --  GLUCOSE 113*  --  111* 80 92 116* 103*  BUN <5*  --  <5* CREATININE 0.72  --  0.74 0.73 0.73 0.63 0.57*  CALCIUM 8.8*  --  8.7* 8.8* 9.0 8.6* 8.7*  MG 1.9  --  1.8 1.9  --  1.7 2.1  AST  --  10*  --   --  17 13* 14*  ALT  --  13*  --   --  16* 13* 11*  ALKPHOS  --  60  --   --  61 49 48  BILITOT  --  0.4  --   --  0.6 0.4 0.2*   ------------------------------------------------------------------------------------------------------------------ estimated creatinine clearance is 141.2 mL/min (by C-G formula based on Cr of 0.57). ------------------------------------------------------------------------------------------------------------------ No results for input(s): HGBA1C in the last 72 hours. ------------------------------------------------------------------------------------------------------------------  Recent Labs  12/14/14 0525  TRIG 100   ------------------------------------------------------------------------------------------------------------------ No results for input(s): TSH, T4TOTAL, T3FREE, THYROIDAB in the last 72 hours.  Invalid input(s): FREET3 ------------------------------------------------------------------------------------------------------------------ No results for input(s): VITAMINB12, FOLATE, FERRITIN, TIBC, IRON, RETICCTPCT in the last 72 hours.  Coagulation profile No results for input(s): INR, PROTIME in the last 168 hours.  No results for input(s): DDIMER in the last 72 hours.  Cardiac Enzymes No results for input(s): CKMB, TROPONINI, MYOGLOBIN in the last 168 hours.  Invalid input(s):  CK ------------------------------------------------------------------------------------------------------------------ Invalid input(s): POCBNP     Time Spent in minutes   35 minutes    ELGERGAWY, DAWOOD M.D on 12/14/2014 at 2:46 PM  Between 7am to 7pm - Pager - [phone]  After 7pm go to [email or web address] - password [email or web address]  [facility]   Office  [phone]

## 2014-12-14 NOTE — Care Management Note (Signed)
Case Management Note  Patient Details  Name: Arlyss RepressJuan Bethel MRN: [id number] Date of Birth: 01/04/1991  Subjective/Objective:                  Date-12-08-14 Initial Assessment Patient transferred from another unit. Spoke with patient at the bedside along with friend Molli HazardMatthew, [phone].  Introduced self as Sports coachcase manager and explained role in discharge planning and how to be reached.  [place].  Verified patient anticipates to go home with room mates at time of discharge.  Patient has no DME.. Expressed potential need for no other DME.  Patient gender identifies as male and states she has [employer] insurance as father is a a Emergency planning/management officerpolice officer in [place]. Patient walks or rides a bike, room mates drive, to MD appointments. Verified patient [facility] [place]. Patient states she can schedule her own follow up, and is currently active with MD. Patient states she has been seen in last 6 months, and has Rx's at home from her MD.    Addendum:  12-14-14 Patient returned to 5W after stay in ICU following EGD and intubation 2/2 erosive esophagitis. Patient currently has PICC with TPN, and plans to get feeding tube tomorrow per nursing report. Will continue to follow. Anticipate discharge possible with IV needs, [facility] following, discharge time frame uncertain at this time.  Expected Discharge Date:                  Expected Discharge Plan:  Home/Self Care  In-House Referral:  Clinical Social Work  Discharge planning Services  CM Consult  Post Acute Care Choice:    Choice offered to:     DME Arranged:    DME Agency:     HH Arranged:    HH Agency:     Status of Service:  In process, will continue to follow  Medicare Important Message Given:    Date Medicare IM Given:    Medicare IM give by:    Date Additional Medicare IM Given:    Additional Medicare Important Message give by:     If discussed at Long  Length of Stay Meetings, dates discussed:    Additional Comments:  Lawerance SabalDebbie Swist, RN 12/14/2014, 11:43 AM

## 2014-12-14 NOTE — Progress Notes (Addendum)
PARENTERAL NUTRITION CONSULT NOTE - FOLLOW UP  Pharmacy Consult for TPN Indication: Intolerance to TF, caustic esophageal injury & stricturing  No Known Allergies  Patient Measurements: Height: 5\' 9"  (175.3 cm) Weight: 164 lb 14.5 oz (74.8 kg) IBW/kg (Calculated) : 70.7 Adjusted Body Weight:  Usual Weight:   Vital Signs: Temp: 98.2 F (36.8 C) (11/14 0411) Temp Source: Oral (11/14 0411) BP: 96/52 mmHg (11/14 0411) Pulse Rate: 48 (11/14 0411) Intake/Output from previous day: 11/13 0701 - 11/14 0700 In: 2132.2 [IV Piggyback:900; TPN:1232.2] Out: -  Intake/Output from this shift:    Labs:  Recent Labs  12/12/14 0600 12/13/14 0520  WBC 6.7 5.9  HGB 11.9* 11.0*  HCT 36.3* 33.0*  PLT 353 344     Recent Labs  12/12/14 0600 12/13/14 0520 12/14/14 0525  NA 141 138 140  K 3.7 3.2* 2.9*  CL 105 103 105  CO2 28 26 27   GLUCOSE 92 116* 103*  BUN 10 6 9   CREATININE 0.73 0.63 0.57*  CALCIUM 9.0 8.6* 8.7*  MG  --  1.7 2.1  PHOS  --  3.6 4.7*  PROT 6.5 5.8* 6.0*  ALBUMIN 2.9* 2.6* 2.8*  AST 17 13* 14*  ALT 16* 13* 11*  ALKPHOS 61 49 48  BILITOT 0.6 0.4 0.2*  PREALBUMIN  --  9.9* 13.8*  TRIG  --   --  100   Estimated Creatinine Clearance: 141.2 mL/min (by C-G formula based on Cr of 0.57).    Recent Labs  12/13/14 1726 12/14/14 0048 12/14/14 0701  GLUCAP 110* 124* 105*    Medications:  Scheduled:  . cefTRIAXone (ROCEPHIN)  IV  2 g Intravenous Q24H  . enoxaparin (LOVENOX) injection  40 mg Subcutaneous Q24H  . [START ON 12/16/2014] enoxaparin (LOVENOX) injection  40 mg Subcutaneous Q24H  . insulin aspart  0-9 Units Subcutaneous 4 times per day  . potassium chloride  10 mEq Intravenous Q1 Hr x 4  . vancomycin  1,000 mg Intravenous 3 times per day    Insulin Requirements in the past 24 hours:  0 units sens SSI  Current Nutrition:  NPO Clinimix E 5/15 at 100 ml/hr + IVFE at 10 ml/hr  Assessment: 24 yo transgender male with caustic injury to  esophagus from ingestion of "amazonian" tea. She is strict NPO. Pharmacy consulted to start TPN for nutrition support 11/12.  Per TCTS " It may be a long time before patient is able to take POs- needs to start TNA now. Unless there is a remarkable turn around in the next couple of days, will likely need a general surgery consult for a surgical feeding tube early next week"  GI: EGG: severe ulcerations and stricture of esophagus. CT: extensive esophageal injury. No frank perforation.  Needs JT to allow esophageal healing, for OR 11/15.  Pain improved & swallowing better (spitting saliva into cup, less volume).  Endocrine: no hx DM. Had hypoglycemia prior to starting TPN, CBGs during TPN 89- 115  Renal: K 2.9 (s/p K x 2 runs yest, 4 runs ordered this AM but none given yet), mag 1.7 (s/p Mg 1g yest), phos 4.7, CoCa 9.7 (Ca x Phos 46).  [facility]  Hepatic: Trig 100 & Palb 13.8. LFTs OK.  Nutrition: goals per RD11/12; assumed well nourished PTA; Goals: Kcal: 2100-2300, protein 112-135 gm   Access: PICC for TPN placed 11/12 TPN day:  12/12/14>>  Plan: - Cont Clinimix-E 5/15 at 100 ml/hr plus lipids at 10 mlhr to provide 120  gm protein and 2184 kcals for 100% support - MVI and trace elements in TPN daily - K runs x 4 and Mg 1g IV - Add pepcid 40 mg/day to TPN - Bmet, Mg, Phos - D/C SSI- cbgs controlled - TPN labs qMon/Thurs -  F/U JT placement, ability to start TF   Marisue Humble, PharmD Clinical Pharmacist [facility]

## 2014-12-14 NOTE — Progress Notes (Signed)
Patient ID: Miguel [name], male   DOB: 05/27/1990, 24 y.o.   MRN: [id number] 4 Days Post-Op  Subjective: Pt spitting in a cup because it hurts to swallow.    Objective: Vital signs in last 24 hours: Temp:  [98.2 F (36.8 C)-98.9 F (37.2 C)] 98.2 F (36.8 C) (11/14 0411) Pulse Rate:  [48-85] 48 (11/14 0411) Resp:  [15-18] 15 (11/14 0411) BP: (95-116)/(52-74) 96/52 mmHg (11/14 0411) SpO2:  [91 %-100 %] 92 % (11/14 0411) Last BM Date: 12/11/14  Intake/Output from previous day: 11/13 0701 - 11/14 0700 In: 2132.2 [IV Piggyback:900; TPN:1232.2] Out: -  Intake/Output this shift:    PE: Abd: soft, essentially NT, ND, +BS Heart: regular Lungs: CTAB  Lab Results:   Recent Labs  12/12/14 0600 12/13/14 0520  WBC 6.7 5.9  HGB 11.9* 11.0*  HCT 36.3* 33.0*  PLT 353 344   BMET  Recent Labs  12/13/14 0520 12/14/14 0525  NA 138 140  K 3.2* 2.9*  CL 103 105  CO2 26 27  GLUCOSE 116* 103*  BUN 6 9  CREATININE 0.63 0.57*  CALCIUM 8.6* 8.7*   PT/INR No results for input(s): LABPROT, INR in the last 72 hours. CMP     Component Value Date/Time   NA 140 12/14/2014 0525   K 2.9* 12/14/2014 0525   CL 105 12/14/2014 0525   CO2 27 12/14/2014 0525   GLUCOSE 103* 12/14/2014 0525   BUN 9 12/14/2014 0525   CREATININE 0.57* 12/14/2014 0525   CALCIUM 8.7* 12/14/2014 0525   PROT 6.0* 12/14/2014 0525   ALBUMIN 2.8* 12/14/2014 0525   AST 14* 12/14/2014 0525   ALT 11* 12/14/2014 0525   ALKPHOS 48 12/14/2014 0525   BILITOT 0.2* 12/14/2014 0525   GFRNONAA >60 12/14/2014 0525   GFRAA >60 12/14/2014 0525   Lipase  No results found for: LIPASE     Studies/Results: No results found.  Anti-infectives: Anti-infectives    Start     Dose/Rate Route Frequency Ordered Stop   12/12/14 1400  cefTRIAXone (ROCEPHIN) 2 g in dextrose 5 % 50 mL IVPB     2 g 100 mL/hr over 30 Minutes Intravenous Every 24 hours 12/11/14 1446     12/11/14 1500  vancomycin (VANCOCIN) IVPB 1000 mg/200 mL  premix     1,000 mg 200 mL/hr over 60 Minutes Intravenous 3 times per day 12/11/14 1446     12/11/14 1400  cefTRIAXone (ROCEPHIN) 1 g in dextrose 5 % 50 mL IVPB  Status:  Discontinued     1 g 100 mL/hr over 30 Minutes Intravenous Every 12 hours 12/11/14 1342 12/11/14 1446       Assessment/Plan  1.  Esophageal injury with stricture -I had a long d/w the patient and the friend.  I explained why he needs a jejunal tube for feeding access.  I did speak with Dr. Dorris FetchHendrickson, who would prefer just a J-tube just incase they need the stomach in the instance he may need an operation.  The patient became tearful when discussing what a j-tube is and that he may likely need this for at least several months while his esophagus heals, etc.  I did also discuss some of the complications one can get with a j-tube as well.  The patient was agreeable to move forward with placement tomorrow. -cont NPO p MN -cont abx therapy for surgical prophylaxis -hold lovenox tomorrow  LOS: 8 days    Riya Huxford E 12/14/2014, 8:36 AM Pager: [phone]

## 2014-12-14 NOTE — Progress Notes (Signed)
Physical Therapy Treatment & discharge Patient Details Name: Miguel [name] MRN: [id number] DOB: [birth year] Today's Date: 12/14/2014    History of Present Illness Patient is a 24 y/o male admitted with serotonin syndrome vs unknown ingestion after presenting with N/V and palpitations after drinking "amazonian tea". Course complicated by esophagitis secondary to presumed caustic ingestion. PMH includes depression, poly substance abuse, transgender.     PT Comments    Pt at MOD I level with gait and S on stairs with step through pattern and no rail.  Pt demonstrating ability to ambulate backwards and do quick turns and head turns with no LOB. Pt no longer appropriate for skilled PT services.  Have ok'ed pt to be up in halls ambulating.  Pt is aware that is she notices any changes in strength or balance that MD can re-order PT services.  Will d/c PT at this time.  Follow Up Recommendations  No PT follow up     Equipment Recommendations  None recommended by PT    Recommendations for Other Services       Precautions / Restrictions Precautions Precautions: None Restrictions Weight Bearing Restrictions: No    Mobility  Bed Mobility Overal bed mobility: Modified Independent                Transfers Overall transfer level: Modified independent Equipment used: None                Ambulation/Gait Ambulation/Gait assistance: Modified independent (Device/Increase time) Ambulation Distance (Feet): 350 Feet Assistive device: None Gait Pattern/deviations: Step-through pattern Gait velocity: WNL Gait velocity interpretation: at or above normal speed for age/gender General Gait Details: no scissoring or unsteadiness noted today.  Practiced gait with and without IV pole with start/stops, quick turns, head turns/nods, and backwards gait.   Stairs Stairs: Yes Stairs assistance: Supervision Stair Management: No rails;Alternating pattern Number of Stairs: 4 General stair  comments: Able to manage 4 stairs with step through pattern with no rails.  Unable to do whole flight due to IV pole, but do not feel pt would have had a problem with this.  Wheelchair Mobility    Modified Rankin (Stroke Patients Only)       Balance Overall balance assessment: Modified Independent   Sitting balance-Leahy Scale: Good     Standing balance support: No upper extremity supported;During functional activity Standing balance-Leahy Scale: Normal                      Cognition Arousal/Alertness: Awake/alert Behavior During Therapy: WFL for tasks assessed/performed Overall Cognitive Status: Within Functional Limits for tasks assessed                      Exercises      General Comments General comments (skin integrity, edema, etc.): Pt reports getting up in room.      Pertinent Vitals/Pain Pain Assessment: No/denies pain    Home Living                      Prior Function            PT Goals (current goals can now be found in the care plan section) Acute Rehab PT Goals Patient Stated Goal: wants to take a shower PT Goal Formulation: All assessment and education complete, DC therapy Potential to Achieve Goals: Good Progress towards PT goals: Goals met/education completed, patient discharged from PT    Frequency       PT Plan  Current plan remains appropriate    Co-evaluation             End of Session Equipment Utilized During Treatment: Gait belt Activity Tolerance: Patient tolerated treatment well Patient left: in chair;with call bell/phone within reach;with family/visitor present     Time: 2637-8588 PT Time Calculation (min) (ACUTE ONLY): 18 min  Charges:  $Gait Training: 8-22 mins                    G Codes:      Kerianne Gurr LUBECK 12/14/2014, 11:46 AM

## 2014-12-14 NOTE — Progress Notes (Signed)
          Daily Rounding Note  12/14/2014, 8:04 AM  LOS: 8 days   SUBJECTIVE:       Pain much improved, still with mild to mod odynophagia, spitting saliva persists but volume is way down.  Not sleeping well, worse than her normal.  Fearful of possible feeding tube placement, on TNA.   OBJECTIVE:         Vital signs in last 24 hours:    Temp:  [98.2 F (36.8 C)-98.9 F (37.2 C)] 98.2 F (36.8 C) (11/14 0411) Pulse Rate:  [48-85] 48 (11/14 0411) Resp:  [15-18] 15 (11/14 0411) BP: (95-116)/(52-74) 96/52 mmHg (11/14 0411) SpO2:  [91 %-100 %] 92 % (11/14 0411) Last BM Date: 12/11/14 Filed Weights   12/06/14 0200 12/07/14 1644  Weight: 165 lb 12.6 oz (75.2 kg) 164 lb 14.5 oz (74.8 kg)   General: pleasant, comfortabe   Heart: RRR Chest: clear bil.  No cough or sob. Abdomen: soft, NT, active BS  Extremities: no CCE Neuro/Psych:  Pleasant, alert, oriented x 3.    Intake/Output from previous day: 11/13 0701 - 11/14 0700 In: 2132.2 [IV Piggyback:900; TPN:1232.2] Out: -   Intake/Output this shift:    Lab Results:  Recent Labs  12/12/14 0600 12/13/14 0520  WBC 6.7 5.9  HGB 11.9* 11.0*  HCT 36.3* 33.0*  PLT 353 344   BMET  Recent Labs  12/12/14 0600 12/13/14 0520 12/14/14 0525  NA 141 138 140  K 3.7 3.2* 2.9*  CL 105 103 105  CO2 28 26 27   GLUCOSE 92 116* 103*  BUN 10 6 9   CREATININE 0.73 0.63 0.57*  CALCIUM 9.0 8.6* 8.7*   LFT  Recent Labs  12/12/14 0600 12/13/14 0520 12/14/14 0525  PROT 6.5 5.8* 6.0*  ALBUMIN 2.9* 2.6* 2.8*  AST 17 13* 14*  ALT 16* 13* 11*  ALKPHOS 61 49 48  BILITOT 0.6 0.4 0.2*   PT/INR No results for input(s): LABPROT, INR in the last 72 hours. Hepatitis Panel No results for input(s): HEPBSAG, HCVAB, HEPAIGM, HEPBIGM in the last 72 hours.  Studies/Results: No results found.  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 EGD 11/10:  Severe esoph ulceration, due to stricturing, unable to pass scope past prox to mid esophagus.  CT chest 11/10: severe pan esophagitis, > mid/lower portion, with fat stranding, ? Submucosal pneumatosis, no pneumomediastinum.  Small bil effusions/atx.  Bronch 11/10: no injury to airway.  Pain improved and swallowing better   PLAN   *  ? Need for temporary enteral feeding tube.  Currently on TNA. Dr Derrell Lollingamirez geering up for j tube tomorrow. Jennye Moccasin.     Brinn Westby  12/14/2014, 8:04 AM Pager: [phone]

## 2014-12-15 DIAGNOSIS — K222 Esophageal obstruction: Secondary | ICD-10-CM

## 2014-12-15 LAB — GLUCOSE, CAPILLARY
GLUCOSE-CAPILLARY: 90 mg/dL (ref 65–99)
Glucose-Capillary: 102 mg/dL — ABNORMAL HIGH (ref 65–99)
Glucose-Capillary: 113 mg/dL — ABNORMAL HIGH (ref 65–99)
Glucose-Capillary: 86 mg/dL (ref 65–99)

## 2014-12-15 LAB — BASIC METABOLIC PANEL
Anion gap: 6 (ref 5–15)
BUN: 12 mg/dL (ref 6–20)
CALCIUM: 8.5 mg/dL — AB (ref 8.9–10.3)
CO2: 26 mmol/L (ref 22–32)
CREATININE: 0.64 mg/dL (ref 0.61–1.24)
Chloride: 106 mmol/L (ref 101–111)
Glucose, Bld: 90 mg/dL (ref 65–99)
Potassium: 3.5 mmol/L (ref 3.5–5.1)
Sodium: 138 mmol/L (ref 135–145)

## 2014-12-15 LAB — MAGNESIUM: Magnesium: 2.1 mg/dL (ref 1.7–2.4)

## 2014-12-15 MED ORDER — POTASSIUM CHLORIDE 10 MEQ/50ML IV SOLN
10.0000 meq | INTRAVENOUS | Status: AC
  Administered 2014-12-15 (×2): 10 meq via INTRAVENOUS
  Filled 2014-12-15 (×2): qty 50

## 2014-12-15 MED ORDER — FAT EMULSION 20 % IV EMUL
240.0000 mL | INTRAVENOUS | Status: AC
  Administered 2014-12-15: 240 mL via INTRAVENOUS
  Filled 2014-12-15: qty 250

## 2014-12-15 MED ORDER — ALTEPLASE 2 MG IJ SOLR
2.0000 mg | Freq: Once | INTRAMUSCULAR | Status: AC
  Administered 2014-12-15: 2 mg
  Filled 2014-12-15: qty 2

## 2014-12-15 MED ORDER — TRACE MINERALS CR-CU-MN-SE-ZN 10-1000-500-60 MCG/ML IV SOLN
INTRAVENOUS | Status: AC
  Administered 2014-12-15: 18:00:00 via INTRAVENOUS
  Filled 2014-12-15: qty 2400

## 2014-12-15 MED ORDER — PRO-STAT SUGAR FREE PO LIQD
30.0000 mL | Freq: Three times a day (TID) | ORAL | Status: DC
  Administered 2014-12-15 – 2014-12-16 (×5): 30 mL via ORAL
  Administered 2014-12-17: 10:00:00 via ORAL
  Administered 2014-12-17 – 2014-12-19 (×5): 30 mL via ORAL
  Filled 2014-12-15 (×14): qty 30

## 2014-12-15 NOTE — Progress Notes (Signed)
PARENTERAL NUTRITION CONSULT NOTE - FOLLOW UP  Pharmacy Consult for TPN Indication: Intolerance to TF, caustic esophageal injury & stricturing  No Known Allergies  Patient Measurements: Height: 5\' 9"  (175.3 cm) Weight: 164 lb 14.5 oz (74.8 kg) IBW/kg (Calculated) : 70.7  Vital Signs: Temp: 98 F (36.7 C) (11/15 0514) Temp Source: Oral (11/15 0514) BP: 100/52 mmHg (11/15 0514) Pulse Rate: 80 (11/15 0514) Intake/Output from previous day: 11/14 0701 - 11/15 0700 In: 461 [TPN:461] Out: -  Intake/Output from this shift:    Labs:  Recent Labs  12/13/14 0520  WBC 5.9  HGB 11.0*  HCT 33.0*  PLT 344     Recent Labs  12/13/14 0520 12/14/14 0525 12/15/14 0532  NA 138 140 138  K 3.2* 2.9* 3.5  CL 103 105 106  CO2 26 27 26   GLUCOSE 116* 103* 90  BUN 6 9 12   CREATININE 0.63 0.57* 0.64  CALCIUM 8.6* 8.7* 8.5*  MG 1.7 2.1 2.1  PHOS 3.6 4.7*  --   PROT 5.8* 6.0*  --   ALBUMIN 2.6* 2.8*  --   AST 13* 14*  --   ALT 13* 11*  --   ALKPHOS 49 48  --   BILITOT 0.4 0.2*  --   PREALBUMIN 9.9* 13.8*  --   TRIG  --  100  --    Estimated Creatinine Clearance: 141.2 mL/min (by C-G formula based on Cr of 0.64).    Recent Labs  12/14/14 1811 12/14/14 2354 12/15/14 0540  GLUCAP 95 106* 90   Insulin Requirements in the past 24 hours:  0 units sens SSI, SSI/CBGs dc'd today  Current Nutrition:  NPO Clinimix E 5/15 at 100 ml/hr + IVFE at 10 ml/hr  Assessment: 24 yo transgender male with caustic injury to esophagus from ingestion of "amazonian" tea. She is strict NPO. Pharmacy consulted to start TPN for nutrition support 11/12.  Per TCTS " It may be a long time before patient is able to take POs- needs to start TNA now. Unless there is a remarkable turn around in the next couple of days, will likely need a general surgery consult for a surgical feeding tube early next week"  GI: EGG: severe ulcerations and stricture of esophagus. CT: extensive esophageal injury. No  frank perforation.  Needs JT to allow esophageal healing, for OR 11/15.  Pain improved & swallowing better (spitting saliva into cup, less volume). Pt reluctant to have J-tube placed.   Endocrine: no hx DM. Had hypoglycemia prior to starting TPN, CBGs during TPN 90-106  Renal: K  3.5 (s/p K x4 runs yest) mag 2.1 (s/p Mg 1g yest),  [facility]  Hepatic: Trig 100 & Palb 13.8. LFTs OK.  Nutrition: goals per RD11/12; assumed well nourished PTA; Goals: Kcal: 2100-2300, protein 112-135 gm   Access: PICC for TPN placed 11/12 TPN day:  12/12/14>>  Plan: - Cont Clinimix-E 5/15 at 100 ml/hr plus lipids at 10 mlhr to provide 120 gm protein and 2184 kcals for 100% support - MVI and trace elements in TPN daily - K runs x 2 - continue pepcid 40 mg/day to TPN - Bmet, Mg in am - D/C SSI- cbgs controlled - TPN labs qMon/Thurs -  F/U JT placement, ability to start TF  Herby AbrahamMichelle T. Clarita Mcelvain, Pharm.D. [phone] 12/15/2014 8:18 AM

## 2014-12-15 NOTE — Progress Notes (Addendum)
Nutrition Follow-up  DOCUMENTATION CODES:   Not applicable  INTERVENTION:   -Calorie Count ordered by MD; will follow-up with results -Obtained food preferences and communicated with nutritional services staff -High Protein Snack (Soy Milk) TID -30 ml Prostat TID  NUTRITION DIAGNOSIS:   Inadequate oral intake related to altered GI function as evidenced by  (TPN dependent, esophageal stricture).  Ongoing  GOAL:   Patient will meet greater than or equal to 90% of their needs  Met with TPN  MONITOR:   PO intake, Supplement acceptance, Diet advancement, Labs, Weight trends, Skin, I & O's  REASON FOR ASSESSMENT:   Consult Calorie Count  ASSESSMENT:   24 y/o hispanic male, PMHx  transgender male- is on estrogen (goes by Miguel [name]), Depression, polysubstance abuse, who presents with nausea, vomiting, palpitations, confusion, tachycardia, bleeding after drinking  "[place] Tea" ([place]). EGD reveals severe caustic injury (grade 3) w/ stricturing, ulcerations and necrosis.    Pt transferred from ICU to medical floor on 12/11/14.   Pt seen per request of RN. Case discussed with RN prior to visiting pt. Per GI note, EGD and barium swallow study revealed esophageal stricture. Barium swallow study revealed that liquid was able to pass through stricture, however, barium tablet was not. As a result, pt was advanced to a full liquid diet. RN confirmed that pt continues to refuse j-tube placement and would rather continue with TPN. Surgery has signed off.   Spoke with pt at bedside. She reports her swallow function is improving and denies any pain with swallowing. She expressed being overwhelmed with the changes in her care, particularly being on a diet. Noted multiple juices on bedside table; pt reports she is able to drink thin liquids (juices and water) without difficulty. However, she feels she would be unable to tolerate thicker textured liquids, such as grits and oatmeal.   Noted  from initial assessment that pt is a vegetarian; obtained food preferences from her so that nutritional services staff could better provide items consistent with her usual intake. She reports she does consume eggs, however, does not consume milk and yogurt, due to lactose intolerance. She avoids animal products, including jello. Reviewed foods available on clear liquid and full liquid diet and discussed which foods are available to her within diet restrictions. Pt reports she is happy to consume mostly juices as this time. She is also open to trying nutritional supplements as long as there are no animal or milk products in them. Also reiterated to pt that she is receiving TPN, which is currently providing most of her nutritional needs. Pt expressed appreciation for visit. Discussed findings with RN.   Reviewed pharmacy note from 12/15/14; continues to receive Clinimix-E 5/15 at 100 ml/hr plus lipids at 10 mlhr to provide 120 gm protein and 2184 kcals for 100% support  Labs reviewed.  Diet Order:  TPN (CLINIMIX-E) Adult TPN (CLINIMIX-E) Adult Diet full liquid Room service appropriate?: Yes; Fluid consistency:: Thin Diet NPO time specified  Skin:  Reviewed, no issues  Last BM:  12/14/14  Height:   Ht Readings from Last 1 Encounters:  12/07/14 _0  (1.753 m)    Weight:   Wt Readings from Last 1 Encounters:  12/07/14 164 lb 14.5 oz (74.8 kg)    Ideal Body Weight:  66 kg  BMI:  Body mass index is 24.34 kg/(m^2).  Estimated Nutritional Needs:   Kcal:  1900-2100  Protein:  100-115 grams  Fluid:  1.9-2.1 L  EDUCATION NEEDS:   Education needs addressed  Miguel [name] A. Jimmye Norman, RD, LDN, CDE Pager: [phone] After hours Pager: [phone]

## 2014-12-15 NOTE — Progress Notes (Signed)
Patient ID: Miguel RepressJuan [name], male   DOB: 12/19/1990, 24 y.o.   MRN: [id number] 5 Days Post-Op  Subjective: Pt feels ok this morning.    Objective: Vital signs in last 24 hours: Temp:  [98 F (36.7 C)-98.8 F (37.1 C)] 98 F (36.7 C) (11/15 0514) Pulse Rate:  [68-89] 80 (11/15 0514) Resp:  [16-18] 16 (11/15 0514) BP: (93-100)/(52-63) 100/52 mmHg (11/15 0514) SpO2:  [92 %-100 %] 92 % (11/15 0514) Last BM Date: 12/14/14  Intake/Output from previous day: 11/14 0701 - 11/15 0700 In: 461 [TPN:461] Out: -  Intake/Output this shift:    PE: Abd: soft, NT, ND, +BS  Lab Results:   Recent Labs  12/13/14 0520  WBC 5.9  HGB 11.0*  HCT 33.0*  PLT 344   BMET  Recent Labs  12/14/14 0525 12/15/14 0532  NA 140 138  K 2.9* 3.5  CL 105 106  CO2 27 26  GLUCOSE 103* 90  BUN 9 12  CREATININE 0.57* 0.64  CALCIUM 8.7* 8.5*   PT/INR No results for input(s): LABPROT, INR in the last 72 hours. CMP     Component Value Date/Time   NA 138 12/15/2014 0532   K 3.5 12/15/2014 0532   CL 106 12/15/2014 0532   CO2 26 12/15/2014 0532   GLUCOSE 90 12/15/2014 0532   BUN 12 12/15/2014 0532   CREATININE 0.64 12/15/2014 0532   CALCIUM 8.5* 12/15/2014 0532   PROT 6.0* 12/14/2014 0525   ALBUMIN 2.8* 12/14/2014 0525   AST 14* 12/14/2014 0525   ALT 11* 12/14/2014 0525   ALKPHOS 48 12/14/2014 0525   BILITOT 0.2* 12/14/2014 0525   GFRNONAA >60 12/15/2014 0532   GFRAA >60 12/15/2014 0532   Lipase  No results found for: LIPASE     Studies/Results: Dg Esophagus  12/14/2014  CLINICAL DATA:  Ingestion of caustic material. Possible need for enteric feeds. Able to drink water earlier today. Severe mid esophageal stricture on upper endoscopy. EXAM: ESOPHOGRAM/BARIUM SWALLOW TECHNIQUE: Single contrast examination was performed using Omnipaque followed by thin barium liquid and tablet. FLUOROSCOPY TIME:  Fluoroscopy Time:  1 minutes 30 seconds COMPARISON:  CT of the chest on 12/10/2014 FINDINGS:  Initially sips of water were swell without difficulty. Subsequently Omnipaque was administered and swallowed without difficulty. There is no evidence for contrast leak. Barium was then administered. There is a long segment esophageal stricture beginning at the level of the aortic arch and extending to the gastroesophageal junction. Barium tablet is transiently at the level of the thoracic inlet. Subsequently barium tablet is upheld for the duration of the study at the level of the aortic arch. The patient is not symptomatic with the upheld barium tablet at this level. IMPRESSION: Long segment lower esophageal stricture, beginning at the level of the aortic arch. The salient findings were discussed with Dr. Rhea BeltonPyrtle on 12/14/2014 at 6:35 pm. Electronically Signed   By: Norva PavlovElizabeth  Brown M.D.   On: 12/14/2014 18:36    Anti-infectives: Anti-infectives    Start     Dose/Rate Route Frequency Ordered Stop   12/12/14 1400  cefTRIAXone (ROCEPHIN) 2 g in dextrose 5 % 50 mL IVPB     2 g 100 mL/hr over 30 Minutes Intravenous Every 24 hours 12/11/14 1446     12/11/14 1500  vancomycin (VANCOCIN) IVPB 1000 mg/200 mL premix     1,000 mg 200 mL/hr over 60 Minutes Intravenous 3 times per day 12/11/14 1446     12/11/14 1400  cefTRIAXone (ROCEPHIN) 1 g  in dextrose 5 % 50 mL IVPB  Status:  Discontinued     1 g 100 mL/hr over 30 Minutes Intravenous Every 12 hours 12/11/14 1342 12/11/14 1446       Assessment/Plan  1.  Esophageal injury with stricture -barium swallow shows a segment of the esophagus with a pretty significant stricture.  i suspect the patient will still not be able to tolerate an oral diet for quite some time.  j-tube reoffered, but patient refuses and wants to just do TNA.  Patient and friends in the room seem to have a poor understanding of the significance this will have on his life going forward.  This has been thoroughly explained by multiple services.  I agree with GI that enteral feeds is much  better than TNA, but patient does not want a J-tube at this time.  We will sign off for now.  Please call us back if the patient changes his mind.   LOS: 9 days    Deago Burruss E 12/15/2014, 8:41 AM Pager: [phone]

## 2014-12-15 NOTE — Progress Notes (Signed)
Patient Demographics  Miguel [name], is a 24 y.o. male, DOB - 22-Jun-1990, ZOX:[id number]  Admit date - 12/05/2014   Admitting Physician Ron Parker, MD  Outpatient Primary MD for the patient is No primary care provider on file.  LOS - 9   Chief Complaint  Patient presents with  . Ingestion       Admission HPI/Brief narrative: 24 year old male (transgender to male, goes by name of Miguel [name]) brought to ED with c/o nausea, vomiting, palpitations and confusion, patient was thought to have serotonin syndrome,  Had ingested "Amazonian tea" which someone gave her, With failure to improve caustic injury to the esophagus was suspected. GI was consulted. 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 A CT chest showed extensive esophageal injury but no frank perforation. had bronchoscopy 11/10 with clear airway without evidence of chemical burns , patient has been seen by CT surgery as well . Started on empiric coverage of vancomycin and Rocephin secondary to fever, started on TPN , surgery has been consulted regarding surgical feeding tube, patient ivery hesitant to  surgical feeding tube, and by GI to repeat EGD 11/18 with possible dilatation .  Subjective:   Miguel [name] today has, No headache, reports paincan do subsided, able to swallow saliva , tolerating clear liquid .  Assessment & Plan    Principal Problem:   Serotonin syndrome Active Problems:   Toxic effect of other ingested (parts of) plant(s), undetermined, initial encounter   Major depressive disorder, recurrent episode (HCC)   FUO (fever of unknown origin)   Ingestion of caustic substance   Dysphagia   Odynophagia   Altered mental status   Acute on chronic respiratory failure (HCC)  SIRS - Currently Afebrile, no leukocytosis -  Due to caustic injury to esophagus.  caustic injury to esophagus  - EGD 11/10 showing  Severe ulceration of visualized esophagus with stricturing and necrosis likely secondary to caustic ingestion  - Surgery consulted regarding need surgical feeding tube as per CT surgery recommendation , patient is very hesitant to surgical J-tube. - Currently on TPN. - Seen by GI, advanced to clear liquid diet today, and is to repeat endoscopy 12/18/2014 by GI with possible dilation of the longus facial stricture. - Continue with IV vancomycin and Rocephin empirically For total of 7 days as discussed with CT surgery  started on 11/11 . - Nutrition consult for calorie count .  Depression  - Can resume Zoloft when able to take by mouth    hypokalemia - Managed by pharmacy as part of TPN management  Code Status: full  Family Communication: discussed with patient and friend at bedside  Disposition Plan: pending further workup    Procedures  - Bronchoscopy 11/10 -  EGD 11/10   Consults   PCCM GI CT surgery Gen. surgery   Medications  Scheduled Meds: . cefTRIAXone (ROCEPHIN)  IV  2 g Intravenous Q24H  . [START ON 12/16/2014] enoxaparin (LOVENOX) injection  40 mg Subcutaneous Q24H  . vancomycin  1,000 mg Intravenous 3 times per day   Continuous Infusions: . sodium chloride 10 mL/hr at 12/13/14 1600  . Marland KitchenTPN (CLINIMIX-E) Adult 100 mL/hr at 12/14/14 1752   And  . fat emulsion 240 mL (12/14/14 1753)  . Marland Kitchen  TPN (CLINIMIX-E) Adult     And  . fat emulsion     PRN Meds:.[DISCONTINUED] acetaminophen **OR** acetaminophen, fentaNYL (SUBLIMAZE) injection, [DISCONTINUED] ondansetron **OR** ondansetron (ZOFRAN) IV, sodium chloride  DVT Prophylaxis  Lovenox   Lab Results  Component Value Date   PLT 344 12/13/2014    Antibiotics    Anti-infectives    Start     Dose/Rate Route Frequency Ordered Stop   12/12/14 1400  cefTRIAXone (ROCEPHIN) 2 g in dextrose 5 % 50 mL IVPB     2 g 100 mL/hr over 30 Minutes Intravenous Every 24 hours 12/11/14 1446     12/11/14 1500  vancomycin  (VANCOCIN) IVPB 1000 mg/200 mL premix     1,000 mg 200 mL/hr over 60 Minutes Intravenous 3 times per day 12/11/14 1446     12/11/14 1400  cefTRIAXone (ROCEPHIN) 1 g in dextrose 5 % 50 mL IVPB  Status:  Discontinued     1 g 100 mL/hr over 30 Minutes Intravenous Every 12 hours 12/11/14 1342 12/11/14 1446          Objective:   Filed Vitals:   12/14/14 1434 12/14/14 2132 12/15/14 0514 12/15/14 1422  BP: 99/55 93/63 100/52 105/66  Pulse: 68 89 80 98  Temp: 98.8 F (37.1 C) 98.5 F (36.9 C) 98 F (36.7 C) 99 F (37.2 C)  TempSrc: Oral Oral Oral Oral  Resp: 18 16 16 18   Height:      Weight:      SpO2: 99% 100% 92% 91%    Wt Readings from Last 3 Encounters:  12/07/14 74.8 kg (164 lb 14.5 oz)     Intake/Output Summary (Last 24 hours) at 12/15/14 1508 Last data filed at 12/15/14 0900  Gross per 24 hour  Intake    461 ml  Output      0 ml  Net    461 ml     Physical Exam  Awake Alert, Oriented X 3, No new F.N deficits, Normal affect [place].AT,PERRAL Supple Neck,No JVD, No cervical lymphadenopathy appriciated.  Symmetrical Chest wall movement, Good air movement bilaterally, CTAB RRR,No Gallops,Rubs or new Murmurs, No Parasternal Heave +ve B.Sounds, Abd Soft, No tenderness, No organomegaly appriciated, No rebound - guarding or rigidity. No Cyanosis, Clubbing or edema, No new Rash or bruise    Data Review   Micro Results Recent Results (from the past 240 hour(s))  MRSA PCR Screening     Status: None   Collection Time: 12/06/14  2:48 AM  Result Value Ref Range Status   MRSA by PCR NEGATIVE NEGATIVE Final    Comment:        The GeneXpert MRSA Assay (FDA approved for NASAL specimens only), is one component of a comprehensive MRSA colonization surveillance program. It is not intended to diagnose MRSA infection nor to guide or monitor treatment for MRSA infections.   Culture, blood (routine x 2)     Status: None   Collection Time: 12/09/14  2:50 PM  Result Value  Ref Range Status   Specimen Description BLOOD RIGHT ANTECUBITAL  Final   Special Requests BOTTLES DRAWN AEROBIC AND ANAEROBIC 10CC   Final   Culture NO GROWTH 5 DAYS  Final   Report Status 12/14/2014 FINAL  Final  Culture, blood (routine x 2)     Status: None   Collection Time: 12/09/14  3:00 PM  Result Value Ref Range Status   Specimen Description BLOOD LEFT HAND  Final   Special Requests BOTTLES DRAWN AEROBIC ONLY  10CC  Final   Culture NO GROWTH 5 DAYS  Final   Report Status 12/14/2014 FINAL  Final  Urine culture     Status: None   Collection Time: 12/09/14  6:45 PM  Result Value Ref Range Status   Specimen Description URINE, CLEAN CATCH  Final   Special Requests NONE  Final   Culture 60,000 COLONIES/ml LACTOBACILLUS SPECIES  Final   Report Status 12/11/2014 FINAL  Final  MRSA PCR Screening     Status: None   Collection Time: 12/10/14  1:03 PM  Result Value Ref Range Status   MRSA by PCR NEGATIVE NEGATIVE Final    Comment:        The GeneXpert MRSA Assay (FDA approved for NASAL specimens only), is one component of a comprehensive MRSA colonization surveillance program. It is not intended to diagnose MRSA infection nor to guide or monitor treatment for MRSA infections.     Radiology Reports Ct Chest W Contrast  12/10/2014  CLINICAL DATA:  Ingestion of caustic substance. Severe mid esophageal stricture on upper endoscopy. EXAM: CT CHEST WITH CONTRAST TECHNIQUE: Multidetector CT imaging of the chest was performed during intravenous contrast administration. CONTRAST:  75mL OMNIPAQUE IOHEXOL 300 MG/ML  SOLN COMPARISON:  12/05/2014 chest radiograph. FINDINGS: Mediastinum/Nodes: Normal heart size. No pericardial fluid/thickening. Normal caliber thoracic aorta. Borderline dilated main pulmonary artery (3.0 cm diameter). No central pulmonary emboli. Normal visualized thyroid. There is severe circumferential wall thickening throughout the entire visualized thoracic esophagus, most  severe in the mid to lower thoracic esophagus. Bubbly central gas in the upper thoracic esophagus could represent submucosal pneumatosis. No pneumomediastinum. There is fat stranding and ill-defined fluid surrounding the entire length of the thoracic esophagus, with no mediastinal fluid collection. Triangular homogeneous soft tissue in the anterior mediastinum is most in keeping with residual thymus. No pathologically enlarged axillary, mediastinal or hilar lymph nodes. Lungs/Pleura: No pneumothorax. Small layering bilateral pleural effusions. Mild passive atelectasis and dependent lower lobes. Mild platelike atelectasis in the lingula. No acute consolidative airspace disease, significant pulmonary nodules or lung masses. Upper abdomen: Unremarkable. Musculoskeletal: No aggressive appearing focal osseous lesions. Moderate symmetric gynecomastia. IMPRESSION: 1. Severe circumferential wall thickening throughout the thoracic esophagus, most severe in the mid to lower thoracic esophagus. Fat stranding and ill-defined fluid surrounding the entire length of the thoracic esophagus. Findings are most in keeping with acute severe esophagitis. 2. Bubbly central gas in the upper thoracic esophagus could represent submucosal pneumatosis. No pneumomediastinum. No focal mediastinal fluid collection. 3. Small bilateral pleural effusions with mild bibasilar atelectasis. Electronically Signed   By: Delbert PhenixJason A Poff M.D.   On: 12/10/2014 16:52   Dg Esophagus  12/14/2014  CLINICAL DATA:  Ingestion of caustic material. Possible need for enteric feeds. Able to drink water earlier today. Severe mid esophageal stricture on upper endoscopy. EXAM: ESOPHOGRAM/BARIUM SWALLOW TECHNIQUE: Single contrast examination was performed using Omnipaque followed by thin barium liquid and tablet. FLUOROSCOPY TIME:  Fluoroscopy Time:  1 minutes 30 seconds COMPARISON:  CT of the chest on 12/10/2014 FINDINGS: Initially sips of water were swell without  difficulty. Subsequently Omnipaque was administered and swallowed without difficulty. There is no evidence for contrast leak. Barium was then administered. There is a long segment esophageal stricture beginning at the level of the aortic arch and extending to the gastroesophageal junction. Barium tablet is transiently at the level of the thoracic inlet. Subsequently barium tablet is upheld for the duration of the study at the level of the aortic arch. The patient  is not symptomatic with the upheld barium tablet at this level. IMPRESSION: Long segment lower esophageal stricture, beginning at the level of the aortic arch. The salient findings were discussed with Dr. Rhea Belton on 12/14/2014 at 6:35 pm. Electronically Signed   By: Norva Pavlov M.D.   On: 12/14/2014 18:36   Dg Chest Portable 1 View  12/05/2014  CLINICAL DATA:  Drug overdose. EXAM: PORTABLE CHEST 1 VIEW COMPARISON:  None. FINDINGS: The heart size and mediastinal contours are within normal limits. Both lungs are clear. The visualized skeletal structures are unremarkable. IMPRESSION: Normal chest x-ray. Electronically Signed   By: Rudie Meyer M.D.   On: 12/05/2014 21:30     CBC  Recent Labs Lab 12/09/14 0638 12/11/14 0455 12/12/14 0600 12/13/14 0520  WBC 8.0 10.7* 6.7 5.9  HGB 12.1* 11.4* 11.9* 11.0*  HCT 37.1* 34.6* 36.3* 33.0*  PLT 277 314 353 344  MCV 91.4 89.2 90.3 88.7  MCH 29.8 29.4 29.6 29.6  MCHC 32.6 32.9 32.8 33.3  RDW 12.6 12.5 12.6 12.4    Chemistries   Recent Labs Lab 12/09/14 0724 12/10/14 0602 12/11/14 0455 12/12/14 0600 12/13/14 0520 12/14/14 0525 12/15/14 0532  NA  --  137 141 141 138 140 138  K  --  3.5 3.0* 3.7 3.2* 2.9* 3.5  CL  --  102 104 105 103 105 106  CO2  --  GLUCOSE  --  111* 80 92 116* 103* 90  BUN  --  <5* CREATININE  --  0.74 0.73 0.73 0.63 0.57* 0.64  CALCIUM  --  8.7* 8.8* 9.0 8.6* 8.7* 8.5*  MG  --  1.8 1.9  --  1.7 2.1 2.1  AST 10*  --   --  17  13* 14*  --   ALT 13*  --   --  16* 13* 11*  --   ALKPHOS 60  --   --  61 49 48  --   BILITOT 0.4  --   --  0.6 0.4 0.2*  --    ------------------------------------------------------------------------------------------------------------------ estimated creatinine clearance is 141.2 mL/min (by C-G formula based on Cr of 0.64). ------------------------------------------------------------------------------------------------------------------ No results for input(s): HGBA1C in the last 72 hours. ------------------------------------------------------------------------------------------------------------------  Recent Labs  12/14/14 0525  TRIG 100   ------------------------------------------------------------------------------------------------------------------ No results for input(s): TSH, T4TOTAL, T3FREE, THYROIDAB in the last 72 hours.  Invalid input(s): FREET3 ------------------------------------------------------------------------------------------------------------------ No results for input(s): VITAMINB12, FOLATE, FERRITIN, TIBC, IRON, RETICCTPCT in the last 72 hours.  Coagulation profile No results for input(s): INR, PROTIME in the last 168 hours.  No results for input(s): DDIMER in the last 72 hours.  Cardiac Enzymes No results for input(s): CKMB, TROPONINI, MYOGLOBIN in the last 168 hours.  Invalid input(s): CK ------------------------------------------------------------------------------------------------------------------ Invalid input(s): POCBNP     Time Spent in minutes   30 minutes    Vianne Grieshop M.D on 12/15/2014 at 3:08 PM  Between 7am to 7pm - Pager - [phone]  After 7pm go to [email or web address] - password [facility]  [facility]   Office  [phone]

## 2014-12-15 NOTE — Progress Notes (Signed)
Daily Rounding Note  12/15/2014, 9:46 AM  LOS: 9 days   SUBJECTIVE:       esoph pain is better.  Not spitting up saliva  OBJECTIVE:         Vital signs in last 24 hours:    Temp:  [98 F (36.7 C)-98.8 F (37.1 C)] 98 F (36.7 C) (11/15 0514) Pulse Rate:  [68-89] 80 (11/15 0514) Resp:  [16-18] 16 (11/15 0514) BP: (93-100)/(52-63) 100/52 mmHg (11/15 0514) SpO2:  [92 %-100 %] 92 % (11/15 0514) Last BM Date: 12/14/14 Filed Weights   12/06/14 0200 12/07/14 1644  Weight: 165 lb 12.6 oz (75.2 kg) 164 lb 14.5 oz (74.8 kg)   General: looks well.  No frooling  Heart: rrr Chest: clear  Abdomen: soft, active BS, NT   Extremities: no CCE Neuro/Psych:  Cooperative, alert, no deficits.   Intake/Output from previous day: 11/14 0701 - 11/15 0700 In: 461 [TPN:461] Out: -   Intake/Output this shift:    Lab Results:  Recent Labs  12/13/14 0520  WBC 5.9  HGB 11.0*  HCT 33.0*  PLT 344   BMET  Recent Labs  12/13/14 0520 12/14/14 0525 12/15/14 0532  NA 138 140 138  K 3.2* 2.9* 3.5  CL 103 105 106  CO2 GLUCOSE 116* 103* 90  BUN CREATININE 0.63 0.57* 0.64  CALCIUM 8.6* 8.7* 8.5*   LFT  Recent Labs  12/13/14 0520 12/14/14 0525  PROT 5.8* 6.0*  ALBUMIN 2.6* 2.8*  AST 13* 14*  ALT 13* 11*  ALKPHOS 49 48  BILITOT 0.4 0.2*   PT/INR No results for input(s): LABPROT, INR in the last 72 hours. Hepatitis Panel No results for input(s): HEPBSAG, HCVAB, HEPAIGM, HEPBIGM in the last 72 hours.  Studies/Results: Dg Esophagus  12/14/2014  CLINICAL DATA:  Ingestion of caustic material. Possible need for enteric feeds. Able to drink water earlier today. Severe mid esophageal stricture on upper endoscopy. EXAM: ESOPHOGRAM/BARIUM SWALLOW TECHNIQUE: Single contrast examination was performed using Omnipaque followed by thin barium liquid and tablet. FLUOROSCOPY TIME:  Fluoroscopy Time:  1 minutes 30  seconds COMPARISON:  CT of the chest on 12/10/2014 FINDINGS: Initially sips of water were swell without difficulty. Subsequently Omnipaque was administered and swallowed without difficulty. There is no evidence for contrast leak. Barium was then administered. There is a long segment esophageal stricture beginning at the level of the aortic arch and extending to the gastroesophageal junction. Barium tablet is transiently at the level of the thoracic inlet. Subsequently barium tablet is upheld for the duration of the study at the level of the aortic arch. The patient is not symptomatic with the upheld barium tablet at this level. IMPRESSION: Long segment lower esophageal stricture, beginning at the level of the aortic arch. The salient findings were discussed with Dr. Rhea Belton on 12/14/2014 at 6:35 pm. Electronically Signed   By: Norva Pavlov M.D.   On: 12/14/2014 18:36    ASSESMENT:   *  Caustic injury to esophagus with esoph stricture on EGD and on follow up esophagram.  On the bright side, liquid is able to pass the stricture, but barium tablet did not.  Pt insisting refusing G or J tube and prefers to continue TNA.      PLAN   *  Allow full liquids.  If tolerated, then ? If we allow purees.  Will need ensure or similar supplement. Pt understands  that she will likely not regain ablity to tolerate solids.     *  Plan egd repeat on 11/18.  Will see him that day.     *  Would get nutritional consult for optimization of po nutrition.     Jennye MoccasinSarah Kemani Demarais  12/15/2014, 9:46 AM Pager: [phone]

## 2014-12-16 DIAGNOSIS — G92 Toxic encephalopathy: Secondary | ICD-10-CM

## 2014-12-16 DIAGNOSIS — T5491XS Toxic effect of unspecified corrosive substance, accidental (unintentional), sequela: Secondary | ICD-10-CM

## 2014-12-16 DIAGNOSIS — G2579 Other drug induced movement disorders: Principal | ICD-10-CM

## 2014-12-16 LAB — MAGNESIUM: Magnesium: 2.2 mg/dL (ref 1.7–2.4)

## 2014-12-16 LAB — BASIC METABOLIC PANEL
Anion gap: 6 (ref 5–15)
BUN: 14 mg/dL (ref 6–20)
CHLORIDE: 102 mmol/L (ref 101–111)
CO2: 29 mmol/L (ref 22–32)
CREATININE: 0.62 mg/dL (ref 0.61–1.24)
Calcium: 8.9 mg/dL (ref 8.9–10.3)
GFR calc non Af Amer: >60 mL/min (ref >60)
Glucose, Bld: 107 mg/dL — ABNORMAL HIGH (ref 65–99)
Potassium: 4.1 mmol/L (ref 3.5–5.1)
Sodium: 137 mmol/L (ref 135–145)

## 2014-12-16 LAB — GLUCOSE, CAPILLARY
GLUCOSE-CAPILLARY: 104 mg/dL — AB (ref 65–99)
GLUCOSE-CAPILLARY: 110 mg/dL — AB (ref 65–99)
GLUCOSE-CAPILLARY: 92 mg/dL (ref 65–99)

## 2014-12-16 MED ORDER — TRACE MINERALS CR-CU-MN-SE-ZN 10-1000-500-60 MCG/ML IV SOLN
INTRAVENOUS | Status: AC
  Administered 2014-12-16: 18:00:00 via INTRAVENOUS
  Filled 2014-12-16: qty 1992

## 2014-12-16 MED ORDER — FAT EMULSION 20 % IV EMUL
240.0000 mL | INTRAVENOUS | Status: AC
  Administered 2014-12-16: 240 mL via INTRAVENOUS
  Filled 2014-12-16: qty 250

## 2014-12-16 NOTE — Progress Notes (Signed)
PARENTERAL NUTRITION CONSULT NOTE - FOLLOW UP  Pharmacy Consult for TPN Indication: Intolerance to TF, caustic esophageal injury & stricturing  No Known Allergies  Patient Measurements: Height: 5\' 9"  (175.3 cm) Weight: 164 lb 14.5 oz (74.8 kg) IBW/kg (Calculated) : 70.7  Vital Signs: Temp: 98.3 F (36.8 C) (11/16 0555) Temp Source: Oral (11/16 0555) BP: 86/50 mmHg (11/16 0555) Pulse Rate: 60 (11/16 0555) Intake/Output from previous day: 11/15 0701 - 11/16 0700 In: 100 [P.O.:100] Out: -  Intake/Output from this shift:    Labs: No results for input(s): WBC, HGB, HCT, PLT, APTT, INR in the last 72 hours.   Recent Labs  12/14/14 0525 12/15/14 0532 12/16/14 0449  NA 140 138 137  K 2.9* 3.5 4.1  CL 105 106 102  CO2 27 26 29   GLUCOSE 103* 90 107*  BUN 9 12 14   CREATININE 0.57* 0.64 0.62  CALCIUM 8.7* 8.5* 8.9  MG 2.1 2.1 2.2  PHOS 4.7*  --   --   PROT 6.0*  --   --   ALBUMIN 2.8*  --   --   AST 14*  --   --   ALT 11*  --   --   ALKPHOS 48  --   --   BILITOT 0.2*  --   --   PREALBUMIN 13.8*  --   --   TRIG 100  --   --    Estimated Creatinine Clearance: 141.2 mL/min (by C-G formula based on Cr of 0.62).    Recent Labs  12/15/14 1729 12/15/14 2354 12/16/14 0555  GLUCAP 102* 113* 104*   Insulin Requirements in the past 24 hours:  SSI/CBGs dc'd  Current Nutrition:  Full liquid diet, prostat 30 ml tid, high protein snack (soy milk) TID Clinimix E 5/15 at 100 ml/hr + IVFE at 10 ml/hr  Assessment: 24 yo transgender male with caustic injury to esophagus from ingestion of "amazonian" tea. She is strict NPO. Pharmacy consulted to start TPN for nutrition support 11/12.  Per TCTS " It may be a long time before patient is able to take POs- needs to start TNA now. Unless there is a remarkable turn around in the next couple of days, will likely need a general surgery consult for a surgical feeding tube early next week"  GI: EGG: severe ulcerations and stricture  of esophagus. CT: extensive esophageal injury. No frank perforation.  Needs JT to allow esophageal healing, for OR 11/15.  Pain improved & swallowing better (spitting saliva into cup, less volume). Pt reluctant to have J-tube placed.   Endocrine: no hx DM. Had hypoglycemia prior to starting TPN, CBGs during TPN 90-106  Renal: K  4.1 s/p 2 runs yest, mag 2.2,   [facility]  Hepatic: Trig 100 & Palb 13.8. LFTs OK.  Nutrition: goals per RD11/12; assumed well nourished PTA; Goals: Kcal: 2100-2300, protein 112-135 gm   Access: PICC for TPN placed 11/12 TPN day:  12/12/14>>  Plan: - full liq diet, prostat 30 ml tid = 45 gm protein, high protein snack ( soy milk) TID, calorie count in progress - decrease Clinimix-E 5/15 to 83 ml/hr plus lipids at 10 mlhr to provide ~ 100 gm protein ( ~ 89% support) [id number] kcals ( 90%)  - MVI and trace elements in TPN daily - will change to liquid MVI once tolerating liquids - continue pepcid 40 mg/day to TPN - will change to liquid pepcid once tolerating liquids well -  TPN labs qMon/Thurs -  F/u w/ diet tolerance and calorie count  Herby Abraham, Pharm.D. [phone] 12/16/2014 9:40 AM

## 2014-12-16 NOTE — Progress Notes (Signed)
ANTIBIOTIC CONSULT NOTE - Follow Up  Pharmacy Consult for Vancomycin Indication: empiric for caustic injury to esophagus   No Known Allergies  Patient Measurements: Height: 5\' 9"  (175.3 cm) Weight: 164 lb 14.5 oz (74.8 kg) IBW/kg (Calculated) : 70.7  Vital Signs: Temp: 98.3 F (36.8 C) (11/16 0555) Temp Source: Oral (11/16 0555) BP: 86/50 mmHg (11/16 0555) Pulse Rate: 60 (11/16 0555) Intake/Output from previous day: 11/15 0701 - 11/16 0700 In: 100 [P.O.:100] Out: -  Intake/Output from this shift:    Labs:  Recent Labs  12/14/14 0525 12/15/14 0532 12/16/14 0449  CREATININE 0.57* 0.64 0.62   Estimated Creatinine Clearance: 141.2 mL/min (by C-G formula based on Cr of 0.62).  Recent Labs  12/13/14 1400  VANCOTROUGH 10    Assessment: 24 yo transgender male > male on D#4 vancomycin and rocephin for mediastinitis d/t caustic injury to esophagus, afebrile, no leukocytosis, Continue with IV vancomycin and Rocephin empirically For total of 7 days. Scr stable at 0.62  Vanc 11/11 >> (11/17?) Rocephin 11/11>> (11/17?) VT 11/13: 10 drawn correctly on 1g q8  11/9 blood x 2 : Negative 11/10 MRSA neg 11/9 urine: 60K CFU Lactobacillus  Goal of Therapy:  Vancomycin trough 10-15 mcg/mL  Plan:  Continue Vancomycin 1gm IV q8h Continue Rocephin to 2gm IV q24h Follow renal function, culture data, and clinical progress Will f/u stop date tomorrow  Bayard HuggerMei Boleslaw Borghi, PharmD, BCPS  Clinical Pharmacist  Pager: [phone]   12/16/2014 11:53 AM

## 2014-12-16 NOTE — Progress Notes (Addendum)
Calorie Count Note  48 hour calorie count ordered.  Calorie count initiated on 12/15/14 at dinner. Spoke with RN, who reports very minimal intake of full liquid diet. She reveals that pt still has breakfast tray in the room and pt has been "picking" at it. She reports pt consumed less than 25% of her full liquid tray this morning.   Reviewed pharmacy note; pt currently receiving Clinimix-E 5/15 at 100 ml/hr plus lipids at 10 mlhr to provide 120 gm protein and 2184 kcals for 100% support. Per pharmacy note on 12/16/14, plan to decrease Clinimix-E 5/15 to 83 ml/hr plus lipids at 10 mlhr to provide ~ 100 gm protein ( ~ 89% support) [id number] kcals ( 90%), to account for PO diet intake.   Diet: Full Liquid Supplements: 30 ml Prostat TID (each supplement provides 100 kcals and 15 grams protein)  Day 1: Breakfast: no documented intake, however, very minimal Lunch: n/a Dinner: n/a Supplements: 3 doses of Prostat given since calorie count initiated, per MAR (300 kcals and 45 grams protein)  Estimated Nutritional Needs:   Kcal: 1900-2100  Protein: 100-115 grams  Fluid: 1.9-2.1 L  Nutrition Dx: Inadequate oral intake related to altered GI function as evidenced by  (TPN dependent, esophageal stricture); ongoing  Goal: Patient will meet greater than or equal to 90% of their needs; met with TPN.  Intervention:   -TPN management per pharmacy -Calorie Count ordered by MD; will follow-up with results -Obtained food preferences and communicated with nutritional services staff -High Protein Snack (Soy Milk) TID -30 ml Prostat TID  Mauriah Mcmillen A. Jimmye Norman, RD, LDN, CDE Pager: [phone] After hours Pager: [phone]

## 2014-12-16 NOTE — Progress Notes (Signed)
PROGRESS NOTE  Miguel [name] ZOX:[id number] DOB: Jun 21, 1990 DOA: 12/05/2014 PCP: No primary care provider on file.  HPI/Recap of past 71 hours: 24 year old transgender who identifies as male admitted on 11/5 after complaining of nausea and vomiting plus confusion. Initially thought to have serotonin syndrome. She apparently had ingested specialized herbal tea that someone had given her and with failure to improve, caustic injury to the esophagus was suspected. EGD done by GI noted several ulcerations and stricture and CT noted extensive esophageal injury but no frank perforation. Patient underwent bronchoscopy on 11/10 with clear airway and no evidence of chemical burns. Patient seen by CT surgery as well. Started on empiric coverage of her vancomycin and Rocephin given fever. Surgery evaluated for surgical feeding tube, but patient hesitant for feeding tube. Started on TPN. Plan is for GI to repeat EGD on 11/18 with possible dilatation. Patient's diet has been slowly advanced from nothing by mouth to clear liquid and now tolerating full liquid. Patient today doing okay. No complaints. Saying that she's better today than she's been since admission. Tolerating full liquids  Assessment/Plan:   Toxic effect of other ingested (parts of) plant(s), undetermined, initial encounter   Major depressive disorder, recurrent episode (HCC): Can resume Zoloft 1 able to take by mouth   FUO (fever of unknown origin):   Ingestion of caustic substance causing injury to esophagus, dysphagia and initial toxic metabolic encephalopathy: As above. Diet slowly being advanced. Encephalopathy resolved.    Code Status: Full code  Family Communication: No family present  Disposition Plan: Potentially this weekend pending results of EGD on 11/18   Consultants:  Critical care  GI  Cardiothoracic surgery  General surgery  Procedures:  Bronchoscopy done 11/10  Endoscopy done 11/10  Antibiotics:  IV Rocephin  and 11/11-present  IV vancomycin 11/11-present   Objective: BP 108/58 mmHg  Pulse 99  Temp(Src) 98.9 F (37.2 C) (Oral)  Resp 16  Ht  (1.753 m)  Wt 74.8 kg (164 lb 14.5 oz)  BMI 24.34 kg/m2  SpO2 100%  Intake/Output Summary (Last 24 hours) at 12/16/14 2143 Last data filed at 12/16/14 1909  Gross per 24 hour  Intake 1348.11 ml  Output      0 ml  Net 1348.11 ml   Filed Weights   12/06/14 0200 12/07/14 1644  Weight: 75.2 kg (165 lb 12.6 oz) 74.8 kg (164 lb 14.5 oz)    Exam:   General:  Alert and oriented 3, no acute distress  Cardiovascular: Regular rate and rhythm, S1-S2  Respiratory: Clear to auscultation bilaterally  Abdomen: Soft, nontender, nondistended, positive bowel sounds  Musculoskeletal: Clubbing cyanosis or edema   Data Reviewed: Basic Metabolic Panel:  Recent Labs Lab 12/11/14 0455 12/12/14 0600 12/13/14 0520 12/14/14 0525 12/15/14 0532 12/16/14 0449  NA 141 141 138 140 138 137  K 3.0* 3.7 3.2* 2.9* 3.5 4.1  CL 104 105 103 105 106 102  CO2 GLUCOSE 80 92 116* 103* 90 107*  BUN CREATININE 0.73 0.73 0.63 0.57* 0.64 0.62  CALCIUM 8.8* 9.0 8.6* 8.7* 8.5* 8.9  MG 1.9  --  1.7 2.1 2.1 2.2  PHOS 4.4  --  3.6 4.7*  --   --    Liver Function Tests:  Recent Labs Lab 12/12/14 0600 12/13/14 0520 12/14/14 0525  AST 17 13* 14*  ALT 16* 13* 11*  ALKPHOS 61 49 48  BILITOT 0.6 0.4 0.2*  PROT 6.5 5.8* 6.0*  ALBUMIN 2.9* 2.6* 2.8*   No results for input(s): LIPASE, AMYLASE in the last 168 hours. No results for input(s): AMMONIA in the last 168 hours. CBC:  Recent Labs Lab 12/11/14 0455 12/12/14 0600 12/13/14 0520  WBC 10.7* 6.7 5.9  HGB 11.4* 11.9* 11.0*  HCT 34.6* 36.3* 33.0*  MCV 89.2 90.3 88.7  PLT 314 353 344   Cardiac Enzymes:   No results for input(s): CKTOTAL, CKMB, CKMBINDEX, TROPONINI in the last 168 hours. BNP (last 3 results) No results for input(s): BNP in the last 8760  hours.  ProBNP (last 3 results) No results for input(s): PROBNP in the last 8760 hours.  CBG:  Recent Labs Lab 12/15/14 1729 12/15/14 2354 12/16/14 0555 12/16/14 1200 12/16/14 1806  GLUCAP 102* 113* 104* 110* 92    Recent Results (from the past 240 hour(s))  Culture, blood (routine x 2)     Status: None   Collection Time: 12/09/14  2:50 PM  Result Value Ref Range Status   Specimen Description BLOOD RIGHT ANTECUBITAL  Final   Special Requests BOTTLES DRAWN AEROBIC AND ANAEROBIC 10CC   Final   Culture NO GROWTH 5 DAYS  Final   Report Status 12/14/2014 FINAL  Final  Culture, blood (routine x 2)     Status: None   Collection Time: 12/09/14  3:00 PM  Result Value Ref Range Status   Specimen Description BLOOD LEFT HAND  Final   Special Requests BOTTLES DRAWN AEROBIC ONLY  10CC  Final   Culture NO GROWTH 5 DAYS  Final   Report Status 12/14/2014 FINAL  Final  Urine culture     Status: None   Collection Time: 12/09/14  6:45 PM  Result Value Ref Range Status   Specimen Description URINE, CLEAN CATCH  Final   Special Requests NONE  Final   Culture 60,000 COLONIES/ml LACTOBACILLUS SPECIES  Final   Report Status 12/11/2014 FINAL  Final  MRSA PCR Screening     Status: None   Collection Time: 12/10/14  1:03 PM  Result Value Ref Range Status   MRSA by PCR NEGATIVE NEGATIVE Final    Comment:        The GeneXpert MRSA Assay (FDA approved for NASAL specimens only), is one component of a comprehensive MRSA colonization surveillance program. It is not intended to diagnose MRSA infection nor to guide or monitor treatment for MRSA infections.      Studies: No results found.  Scheduled Meds: . cefTRIAXone (ROCEPHIN)  IV  2 g Intravenous Q24H  . enoxaparin (LOVENOX) injection  40 mg Subcutaneous Q24H  . feeding supplement (PRO-STAT SUGAR FREE 64)  30 mL Oral TID  . vancomycin  1,000 mg Intravenous 3 times per day    Continuous Infusions: . Marland Kitchen.TPN (CLINIMIX-E) Adult 83 mL/hr at  12/16/14 1743   And  . fat emulsion 240 mL (12/16/14 1743)  . sodium chloride 500 mL (12/16/14 1749)     Time spent: 15 minutes  Hollice EspyKRISHNAN,Drayce Tawil K  [facility] Pager [phone]. If 7PM-7AM, please contact night-coverage at [email or web address], password [email or web address] 12/16/2014, 9:43 PM  LOS: 10 days

## 2014-12-17 LAB — COMPREHENSIVE METABOLIC PANEL
ALK PHOS: 56 U/L (ref 38–126)
ALT: 34 U/L (ref 17–63)
AST: 27 U/L (ref 15–41)
Albumin: 3.1 g/dL — ABNORMAL LOW (ref 3.5–5.0)
Anion gap: 6 (ref 5–15)
BUN: 13 mg/dL (ref 6–20)
CO2: 30 mmol/L (ref 22–32)
Calcium: 9.1 mg/dL (ref 8.9–10.3)
Chloride: 103 mmol/L (ref 101–111)
Creatinine, Ser: 0.67 mg/dL (ref 0.61–1.24)
GFR calc non Af Amer: >60 mL/min (ref >60)
Glucose, Bld: 106 mg/dL — ABNORMAL HIGH (ref 65–99)
Potassium: 4.4 mmol/L (ref 3.5–5.1)
SODIUM: 139 mmol/L (ref 135–145)
TOTAL PROTEIN: 6.9 g/dL (ref 6.5–8.1)
Total Bilirubin: 0.4 mg/dL (ref 0.3–1.2)

## 2014-12-17 LAB — TRIGLYCERIDES: Triglycerides: 86 mg/dL (ref <150)

## 2014-12-17 LAB — MAGNESIUM: MAGNESIUM: 2.2 mg/dL (ref 1.7–2.4)

## 2014-12-17 LAB — PHOSPHORUS: PHOSPHORUS: 4.6 mg/dL (ref 2.5–4.6)

## 2014-12-17 MED ORDER — FAMOTIDINE 40 MG/5ML PO SUSR
20.0000 mg | Freq: Two times a day (BID) | ORAL | Status: DC
  Administered 2014-12-17 – 2014-12-18 (×2): 20 mg via ORAL
  Filled 2014-12-17 (×8): qty 2.5

## 2014-12-17 MED ORDER — MORPHINE SULFATE (CONCENTRATE) 10 MG/0.5ML PO SOLN
5.0000 mg | ORAL | Status: DC | PRN
  Administered 2014-12-17 – 2014-12-20 (×9): 5 mg via ORAL
  Filled 2014-12-17 (×9): qty 0.5

## 2014-12-17 MED ORDER — FAT EMULSION 20 % IV EMUL
240.0000 mL | INTRAVENOUS | Status: AC
  Administered 2014-12-17: 240 mL via INTRAVENOUS
  Filled 2014-12-17: qty 250

## 2014-12-17 MED ORDER — SUCRALFATE 1 GM/10ML PO SUSP
1.0000 g | Freq: Three times a day (TID) | ORAL | Status: DC
  Administered 2014-12-17 – 2014-12-20 (×9): 1 g via ORAL
  Filled 2014-12-17 (×9): qty 10

## 2014-12-17 MED ORDER — CLINIMIX E/DEXTROSE (5/15) 5 % IV SOLN
INTRAVENOUS | Status: AC
  Administered 2014-12-17: 17:00:00 via INTRAVENOUS
  Filled 2014-12-17: qty 1992

## 2014-12-17 MED ORDER — MORPHINE SULFATE (PF) 2 MG/ML IV SOLN
2.0000 mg | INTRAVENOUS | Status: AC | PRN
  Administered 2014-12-17 – 2014-12-18 (×4): 2 mg via INTRAVENOUS
  Filled 2014-12-17 (×4): qty 1

## 2014-12-17 MED ORDER — ADULT MULTIVITAMIN LIQUID CH
5.0000 mL | Freq: Every day | ORAL | Status: DC
  Administered 2014-12-18 – 2014-12-20 (×3): 5 mL via ORAL
  Filled 2014-12-17 (×5): qty 5

## 2014-12-17 NOTE — Progress Notes (Signed)
          Daily Rounding Note  12/17/2014, 9:12 AM  LOS: 11 days   SUBJECTIVE:       Tolerating full liquids.  Not able to take dairy from the trays as he is lactose intolerant.   odyspophagia much improved, not completely resolved  OBJECTIVE:         Vital signs in last 24 hours:    Temp:  [98.4 F (36.9 C)-98.9 F (37.2 C)] 98.7 F (37.1 C) (11/17 0542) Pulse Rate:  [84-101] 84 (11/17 0542) Resp:  [16-18] 18 (11/17 0542) BP: (101-110)/(55-72) 110/72 mmHg (11/17 0542) SpO2:  [99 %-100 %] 100 % (11/17 0542) Last BM Date: 12/16/14 Filed Weights   12/06/14 0200 12/07/14 1644  Weight: 75.2 kg (165 lb 12.6 oz) 74.8 kg (164 lb 14.5 oz)   General: pleasant, comfortable.    Heart: RRR Chest: clear bil.  No cough or labored breathing Abdomen: soft, NT, ND,    Extremities: no CCE Neuro/Psych:  Pleasant, calm, no deficits.   Intake/Output from previous day: 11/16 0701 - 11/17 0700 In: 2842.9 [P.O.:120; I.V.:190.2; IV Piggyback:650; TPN:1882.7] Out: 2 [Urine:2]  Intake/Output this shift:    Lab Results: No results for input(s): WBC, HGB, HCT, PLT in the last 72 hours. BMET  Recent Labs  12/15/14 0532 12/16/14 0449 12/17/14 0453  NA 138 137 139  K 3.5 4.1 4.4  CL 106 102 103  CO2 26 29 30   GLUCOSE 90 107* 106*  BUN 12 14 13   CREATININE 0.64 0.62 0.67  CALCIUM 8.5* 8.9 9.1   LFT  Recent Labs  12/17/14 0453  PROT 6.9  ALBUMIN 3.1*  AST 27  ALT 34  ALKPHOS 56  BILITOT 0.4   PT/INR No results for input(s): LABPROT, INR in the last 72 hours. Hepatitis Panel No results for input(s): HEPBSAG, HCVAB, HEPAIGM, HEPBIGM in the last 72 hours.  Studies/Results: No results found.  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 Liquids but not tablet passed through stricture.  Tolerating full liquids but due to lactose intolerance, not taking much  of this.   PLAN   *  EGD with ? Dilation of stricture, 11:30 tomorrow .    *  Puree diet.    Jennye MoccasinSarah Gribbin  12/17/2014, 9:12 AM Pager: [phone]   Attending physician's note   I have taken an interval history, reviewed the chart and examined the patient. I agree with the Advanced Practitioner's note, impression and recommendations.  Tolerating liquids Plan for EGD with potential esophageal stricture dilation tomorrow The risks and benefits as well as alternatives of endoscopic procedure(s) have been discussed and reviewed. All questions answered. The patient agrees to proceed.  Iona BeardK Veena Nandigam, MD [phone] Mon-Fri 8a-5p [phone] after 5p, weekends, holidays

## 2014-12-17 NOTE — Progress Notes (Signed)
PROGRESS NOTE  Miguel RepressJuan [name] QIO:[id number] DOB: 11/08/1990 DOA: 12/05/2014 PCP: No primary care provider on file.  HPI/Recap of past 3224 hours: 24 year old transgender who identifies as male admitted on 11/5 after complaining of nausea and vomiting plus confusion. Initially thought to have serotonin syndrome. She apparently had ingested specialized herbal tea that someone had given her and with failure to improve, caustic injury to the esophagus was suspected. EGD done by GI noted several ulcerations and stricture and CT noted extensive esophageal injury but no frank perforation. Patient underwent bronchoscopy on 11/10 with clear airway and no evidence of chemical burns. Patient seen by CT surgery as well. Started on empiric coverage of her vancomycin and Rocephin given fever. Surgery evaluated for surgical feeding tube, but patient hesitant for feeding tube. Started on TPN. Plan is for GI to repeat EGD on 11/18 with possible dilatation. Patient's diet has been slowly advanced from nothing by mouth to liquids to now dysphagia 1. Tolerating, but overall intake is poor.  Patient herself complains of some pain when swallowing, but otherwise okay.  Assessment/Plan:   Toxic effect of other ingested (parts of) plant(s), undetermined, initial encounter   Major depressive disorder, recurrent episode (HCC): Resume Zoloft following endoscopy  Ingestion of caustic substance causing injury to esophagus, dysphagia and initial toxic metabolic encephalopathy: As above. Diet slowly being advanced. Encephalopathy resolved.  For EGD in the morning    Code Status: Full code  Family Communication: No family present  Disposition Plan: Potentially this weekend pending results of EGD on 11/18   Consultants:  Critical care  GI  Cardiothoracic surgery  General surgery  Procedures:  Bronchoscopy done 11/10  Endoscopy done 11/10  Antibiotics:  IV Rocephin and 11/11-present  IV vancomycin  11/11-present   Objective: BP 108/54 mmHg  Pulse 92  Temp(Src) 98.7 F (37.1 C) (Oral)  Resp 19  Ht 5\' 9"  (1.753 m)  Wt 74.8 kg (164 lb 14.5 oz)  BMI 24.34 kg/m2  SpO2 100%  Intake/Output Summary (Last 24 hours) at 12/17/14 1643 Last data filed at 12/17/14 0700  Gross per 24 hour  Intake 2842.91 ml  Output      2 ml  Net 2840.91 ml   Filed Weights   12/06/14 0200 12/07/14 1644  Weight: 75.2 kg (165 lb 12.6 oz) 74.8 kg (164 lb 14.5 oz)    Exam:  no change from previous day  General:  Alert and oriented 3, no acute distress  Cardiovascular: Regular rate and rhythm, S1-S2  Respiratory: Clear to auscultation bilaterally  Abdomen: Soft, nontender, nondistended, positive bowel sounds  Musculoskeletal: Clubbing cyanosis or edema   Data Reviewed: Basic Metabolic Panel:  Recent Labs Lab 12/11/14 0455  12/13/14 0520 12/14/14 0525 12/15/14 0532 12/16/14 0449 12/17/14 0453  NA 141  < > 138 140 138 137 139  K 3.0*  < > 3.2* 2.9* 3.5 4.1 4.4  CL 104  < > 103 105 106 102 103  CO2 25  < > 26 27 26 29 30   GLUCOSE 80  < > 116* 103* 90 107* 106*  BUN 8  < > 6 9 12 14 13   CREATININE 0.73  < > 0.63 0.57* 0.64 0.62 0.67  CALCIUM 8.8*  < > 8.6* 8.7* 8.5* 8.9 9.1  MG 1.9  --  1.7 2.1 2.1 2.2 2.2  PHOS 4.4  --  3.6 4.7*  --   --  4.6  < > = values in this interval not displayed. Liver Function Tests:  Recent Labs Lab 12/12/14 0600 12/13/14 0520 12/14/14 0525 12/17/14 0453  AST 17 13* 14* 27  ALT 16* 13* 11* 34  ALKPHOS 61 49 48 56  BILITOT 0.6 0.4 0.2* 0.4  PROT 6.5 5.8* 6.0* 6.9  ALBUMIN 2.9* 2.6* 2.8* 3.1*   No results for input(s): LIPASE, AMYLASE in the last 168 hours. No results for input(s): AMMONIA in the last 168 hours. CBC:  Recent Labs Lab 12/11/14 0455 12/12/14 0600 12/13/14 0520  WBC 10.7* 6.7 5.9  HGB 11.4* 11.9* 11.0*  HCT 34.6* 36.3* 33.0*  MCV 89.2 90.3 88.7  PLT 314 353 344   Cardiac Enzymes:   No results for input(s): CKTOTAL,  CKMB, CKMBINDEX, TROPONINI in the last 168 hours. BNP (last 3 results) No results for input(s): BNP in the last 8760 hours.  ProBNP (last 3 results) No results for input(s): PROBNP in the last 8760 hours.  CBG:  Recent Labs Lab 12/15/14 1729 12/15/14 2354 12/16/14 0555 12/16/14 1200 12/16/14 1806  GLUCAP 102* 113* 104* 110* 92    Recent Results (from the past 240 hour(s))  Culture, blood (routine x 2)     Status: None   Collection Time: 12/09/14  2:50 PM  Result Value Ref Range Status   Specimen Description BLOOD RIGHT ANTECUBITAL  Final   Special Requests BOTTLES DRAWN AEROBIC AND ANAEROBIC 10CC   Final   Culture NO GROWTH 5 DAYS  Final   Report Status 12/14/2014 FINAL  Final  Culture, blood (routine x 2)     Status: None   Collection Time: 12/09/14  3:00 PM  Result Value Ref Range Status   Specimen Description BLOOD LEFT HAND  Final   Special Requests BOTTLES DRAWN AEROBIC ONLY  10CC  Final   Culture NO GROWTH 5 DAYS  Final   Report Status 12/14/2014 FINAL  Final  Urine culture     Status: None   Collection Time: 12/09/14  6:45 PM  Result Value Ref Range Status   Specimen Description URINE, CLEAN CATCH  Final   Special Requests NONE  Final   Culture 60,000 COLONIES/ml LACTOBACILLUS SPECIES  Final   Report Status 12/11/2014 FINAL  Final  MRSA PCR Screening     Status: None   Collection Time: 12/10/14  1:03 PM  Result Value Ref Range Status   MRSA by PCR NEGATIVE NEGATIVE Final    Comment:        The GeneXpert MRSA Assay (FDA approved for NASAL specimens only), is one component of a comprehensive MRSA colonization surveillance program. It is not intended to diagnose MRSA infection nor to guide or monitor treatment for MRSA infections.      Studies: No results found.  Scheduled Meds: . cefTRIAXone (ROCEPHIN)  IV  2 g Intravenous Q24H  . enoxaparin (LOVENOX) injection  40 mg Subcutaneous Q24H  . famotidine  20 mg Oral BID  . feeding supplement (PRO-STAT  SUGAR FREE 64)  30 mL Oral TID  . multivitamin  5 mL Oral Daily  . vancomycin  1,000 mg Intravenous 3 times per day    Continuous Infusions: . Marland KitchenTPN (CLINIMIX-E) Adult 83 mL/hr at 12/16/14 1743   And  . fat emulsion 240 mL (12/16/14 1743)  . Marland KitchenTPN (CLINIMIX-E) Adult     And  . fat emulsion    . sodium chloride 500 mL (12/16/14 1749)     Time spent: 10 minutes  Hollice Espy  [facility] Pager [phone]. If 7PM-7AM, please contact night-coverage at [email or web address], password [facility]  12/17/2014, 4:43 PM  LOS: 11 days

## 2014-12-17 NOTE — Progress Notes (Signed)
PARENTERAL NUTRITION CONSULT NOTE - FOLLOW UP  Pharmacy Consult for TPN Indication: Intolerance to TF, caustic esophageal injury & stricturing  No Known Allergies  Patient Measurements: Height: 5\' 9"  (175.3 cm) Weight: 164 lb 14.5 oz (74.8 kg) IBW/kg (Calculated) : 70.7  Vital Signs: Temp: 98.7 F (37.1 C) (11/17 0542) Temp Source: Oral (11/17 0542) BP: 110/72 mmHg (11/17 0542) Pulse Rate: 84 (11/17 0542) Intake/Output from previous day: 11/16 0701 - 11/17 0700 In: 2842.9 [P.O.:120; I.V.:190.2; IV Piggyback:650; TPN:1882.7] Out: 2 [Urine:2] Intake/Output from this shift:    Labs: No results for input(s): WBC, HGB, HCT, PLT, APTT, INR in the last 72 hours.   Recent Labs  12/15/14 0532 12/16/14 0449 12/17/14 0453  NA 138 137 139  K 3.5 4.1 4.4  CL 106 102 103  CO2 26 29 30   GLUCOSE 90 107* 106*  BUN 12 14 13   CREATININE 0.64 0.62 0.67  CALCIUM 8.5* 8.9 9.1  MG 2.1 2.2 2.2  PHOS  --   --  4.6  PROT  --   --  6.9  ALBUMIN  --   --  3.1*  AST  --   --  27  ALT  --   --  34  ALKPHOS  --   --  56  BILITOT  --   --  0.4  TRIG  --   --  86   Estimated Creatinine Clearance: 141.2 mL/min (by C-G formula based on Cr of 0.67).    Recent Labs  12/16/14 0555 12/16/14 1200 12/16/14 1806  GLUCAP 104* 110* 92   Insulin Requirements in the past 24 hours:  SSI/CBGs dc'd  Current Nutrition:  Full liquid diet, prostat 30 ml tid, high protein snack (soy milk) TID Clinimix E 5/15 at 83 ml/hr + IVFE at 10 ml/hr  Assessment: 24 yo transgender male with caustic injury to esophagus from ingestion of "amazonian" tea. She is strict NPO. Pharmacy consulted to start TPN for nutrition support 11/12.  Per TCTS " It may be a long time before patient is able to take POs- needs to start TNA now. Unless there is a remarkable turn around in the next couple of days, will likely need a general surgery consult for a surgical feeding tube early next week"  GI: EGG: severe ulcerations  and stricture of esophagus. CT: extensive esophageal injury. No frank perforation.  Needs JT to allow esophageal healing, for OR 11/15. Pt reluctant to have J-tube placed. Tolerating full liquid diet and prostat TID per MD note.   Endocrine: no hx DM. Had hypoglycemia prior to starting TPN, CBGs during TPN 90-106; CBGs dc'd  [facility] WNL[facility]   [facility]  Hepatic: Trig 100 & Palb 13.8. LFTs OK.  Nutrition: goals per RD11/12; assumed well nourished PTA; Goals: Kcal: 2100-2300, protein 112-135 gm   Access: PICC for TPN placed 11/12 TPN day:  12/12/14>>  Plan: - full liq diet, prostat 30 ml tid = 45 gm protein, high protein snack (soy milk) TID, calorie count in progress - continue Clinimix-E 5/15 at 83 ml/hr plus lipids at 10 mlhr to provide ~ 100 gm protein ( ~ 89% support) [id number] kcals ( 90%)  -  will change to liquid MVI  - will change to liquid pepcid  -  TPN labs qMon/Thurs -  F/u w/ diet tolerance and calorie count  Herby AbrahamMichelle T. Tecora Eustache, Pharm.D. [phone] 12/17/2014 8:47 AM

## 2014-12-17 NOTE — Progress Notes (Addendum)
Calorie Count Note  48 hour calorie count ordered.  Diet: Full liquid/ Dysphagia 1 Supplements: 30 ml Prostat TID (each supplement provides 100 kcals and 15 grams protein)  Pt was advanced from a full liquid diet to a dysphagia 1 diet for lunch today. Staff report pt is tolerating liquids well. However, intake remains poor.  Calorie count data incomplete. RD reviewed pt meal tray selections for period of calorie count. Intake remains minimal due to limited food choices as a result of pt's vegetarianism and lactose intolerance, as well as restrictions of full liquid diet. In the event that pt consumed 100% of all meal trays and supplements during calorie count period, pt would have consumed an average of 835 kcals and 48 grams of protein daily, which meet less than 50% of pt's estimated nutritional needs. Pt will require ongoing nutrition support (TPN vs enteral nutrition via j-tube) to fully meet nutritional needs.   Reviewed pharmacy note from 11/171/16; plan is to continue continue Clinimix-E 5/15 at 83 ml/hr plus lipids at 10 mlhr to provide ~ 100 gm protein ( ~ 89% support) [id number] kcals ( 90%).    Plan is for pt to be NPO after midnight for repeat EGD and potential esophageal dilation.  Day 1 Breakfast: 70 kcals/ 0 grams protein Lunch: no data available Dinner: 107 kcals/ 0 grams protein Supplements: 30 ml Prostat TID (300 kcals and 45 grams protein)  Day 2 Breakfast: no data available Lunch: no data available Dinner: no data available Supplements: 30 ml Prostat TID (300 kcals and 45 grams protein)  Total intake: 389 kcal (20% of minimum estimated needs)  45 protein (45% of minimum estimated needs)  Nutrition Dx: Inadequate oral intake related to altered GI function as evidenced by (TPN dependent, esophageal stricture); ongoing  Goal: Patient will meet greater than or equal to 90% of their needs; met with TPN.  Intervention:   -TPN management per pharmacy -30 ml Prostat  TID  Barton Want A. Jimmye Norman, RD, LDN, CDE Pager: [phone] After hours Pager: [phone]

## 2014-12-17 NOTE — Progress Notes (Signed)
Patient did not want to take MS liquid says she did not like the taste MD was notified.

## 2014-12-18 ENCOUNTER — Inpatient Hospital Stay (HOSPITAL_COMMUNITY): Payer: 59 | Admitting: Certified Registered Nurse Anesthetist

## 2014-12-18 ENCOUNTER — Inpatient Hospital Stay (HOSPITAL_COMMUNITY): Payer: 59

## 2014-12-18 ENCOUNTER — Encounter (HOSPITAL_COMMUNITY)
Admission: EM | Disposition: A | Discharge status: Home / Self Care | Payer: Self-pay | Source: Home / Self Care | Attending: Internal Medicine

## 2014-12-18 ENCOUNTER — Encounter (HOSPITAL_COMMUNITY): Payer: Self-pay

## 2014-12-18 DIAGNOSIS — K222 Esophageal obstruction: Secondary | ICD-10-CM | POA: Insufficient documentation

## 2014-12-18 DIAGNOSIS — F339 Major depressive disorder, recurrent, unspecified: Secondary | ICD-10-CM

## 2014-12-18 HISTORY — PX: ESOPHAGOGASTRODUODENOSCOPY: SHX5428

## 2014-12-18 HISTORY — PX: BALLOON DILATION: SHX5330

## 2014-12-18 SURGERY — EGD (ESOPHAGOGASTRODUODENOSCOPY)
Anesthesia: General

## 2014-12-18 MED ORDER — FAT EMULSION 20 % IV EMUL
240.0000 mL | INTRAVENOUS | Status: AC
  Administered 2014-12-18: 240 mL via INTRAVENOUS
  Filled 2014-12-18: qty 250

## 2014-12-18 MED ORDER — LACTATED RINGERS IV SOLN
INTRAVENOUS | Status: DC | PRN
  Administered 2014-12-18: 12:00:00 via INTRAVENOUS

## 2014-12-18 MED ORDER — PROPOFOL 10 MG/ML IV BOLUS
INTRAVENOUS | Status: DC | PRN
  Administered 2014-12-18: 150 mg via INTRAVENOUS

## 2014-12-18 MED ORDER — FENTANYL CITRATE (PF) 100 MCG/2ML IJ SOLN
INTRAMUSCULAR | Status: AC
Start: 2014-12-18 — End: 2014-12-18
  Filled 2014-12-18: qty 2

## 2014-12-18 MED ORDER — LIDOCAINE HCL (CARDIAC) 20 MG/ML IV SOLN
INTRAVENOUS | Status: DC | PRN
  Administered 2014-12-18: 60 mg via INTRATRACHEAL

## 2014-12-18 MED ORDER — SUCCINYLCHOLINE CHLORIDE 20 MG/ML IJ SOLN
INTRAMUSCULAR | Status: DC | PRN
  Administered 2014-12-18: 100 mg via INTRAVENOUS

## 2014-12-18 MED ORDER — CLINIMIX E/DEXTROSE (5/15) 5 % IV SOLN
INTRAVENOUS | Status: AC
  Administered 2014-12-18: 17:00:00 via INTRAVENOUS
  Filled 2014-12-18: qty 1992

## 2014-12-18 MED ORDER — MORPHINE SULFATE (PF) 2 MG/ML IV SOLN
2.0000 mg | INTRAVENOUS | Status: DC | PRN
  Administered 2014-12-18 – 2014-12-19 (×4): 2 mg via INTRAVENOUS
  Filled 2014-12-18 (×4): qty 1

## 2014-12-18 MED ORDER — FENTANYL CITRATE (PF) 100 MCG/2ML IJ SOLN
INTRAMUSCULAR | Status: DC | PRN
  Administered 2014-12-18: 100 ug via INTRAVENOUS

## 2014-12-18 NOTE — Progress Notes (Signed)
PARENTERAL NUTRITION CONSULT NOTE - FOLLOW UP  Pharmacy Consult for TPN Indication: Intolerance to TF, caustic esophageal injury & stricturing  No Known Allergies  Patient Measurements: Height: 5\' 9"  (175.3 cm) Weight: 164 lb 14.5 oz (74.8 kg) IBW/kg (Calculated) : 70.7  Vital Signs: Temp: 97.6 F (36.4 C) (11/18 0540) Temp Source: Oral (11/18 0540) BP: 94/60 mmHg (11/18 0540) Pulse Rate: 85 (11/17 2215) Intake/Output from previous day:   Intake/Output from this shift:    Labs: No results for input(s): WBC, HGB, HCT, PLT, APTT, INR in the last 72 hours.   Recent Labs  12/16/14 0449 12/17/14 0453  NA 137 139  K 4.1 4.4  CL 102 103  CO2 29 30  GLUCOSE 107* 106*  BUN 14 13  CREATININE 0.62 0.67  CALCIUM 8.9 9.1  MG 2.2 2.2  PHOS  --  4.6  PROT  --  6.9  ALBUMIN  --  3.1*  AST  --  27  ALT  --  34  ALKPHOS  --  56  BILITOT  --  0.4  TRIG  --  86   Estimated Creatinine Clearance: 141.2 mL/min (by C-G formula based on Cr of 0.67).    Recent Labs  12/16/14 0555 12/16/14 1200 12/16/14 1806  GLUCAP 104* 110* 92   Insulin Requirements in the past 24 hours:  None  Current Nutrition:  Clinimix E 5/15 at 83 ml/hr + IVFE at 10 ml/hr  Assessment: 24 yo transgender male with caustic injury to esophagus from ingestion of "amazonian" tea.Pharmacy consulted to start TPN for nutrition support 11/12. Per TCTS " It may be a long time before patient is able to take POs- needs to start TNA now. Unless there is a remarkable turn around in the next couple of days, will likely need a general surgery consult for a surgical feeding tube early next week"  GI: EGG: severe ulcerations and stricture of esophagus. CT: extensive esophageal injury. No frank perforation. Tolerating full liquid diet but PO intake is minimal due to dietary restrictions. Going for EGD and possible esophageal stricture dilation today - famotidine, sucralfate  Endocrine: no hx DM. Had hypoglycemia  prior to starting TPN, CBGs during TPN 90-106; CBGs dc'd  Renal: lytes WNL as of 11[facility]17[facility]  Pulm: 100% RA  Cards: BP 94/60, HR 85 - no meds  Hepatic: Trig 86, prealbumin 13.8, LFTs WNL  Neuro: A&O  ID: Empiric CTX - Tmax 99.4, last WBC WNL as of 11/13, cultures negative  Best Practices:   Nutrition: goals per RD 11/15 Kcal: 1900-2100 Protein 100-115 gm/day   Access: PICC for TPN placed 11/12 TPN day:  12/12/14>>  Plan: - Continue Clinimix E5/15 at 3083ml/hr and lipids at 1310ml/hr - provides ~100% goal nutrition (100gm protein/day and 1894 kcal/day) - F/u AM BMET + CBC - Continue liquid MVI daily - Continue liquid famotidine 20mg  BID  - F/u enteral diet  Lysle Pearlachel Merion Grimaldo, PharmD, BCPS Pager # [phone] 12/18/2014 8:22 AM

## 2014-12-18 NOTE — Anesthesia Procedure Notes (Signed)
Procedure Name: Intubation Date/Time: 12/18/2014 11:50 AM Performed by: [place] Pink Pre-anesthesia Checklist: Patient identified, Emergency Drugs available, Suction available, Patient being monitored and Timeout performed Patient Re-evaluated:Patient Re-evaluated prior to inductionOxygen Delivery Method: Circle system utilized Preoxygenation: Pre-oxygenation with 100% oxygen Intubation Type: IV induction and Rapid sequence Laryngoscope Size: Mac and 4 Grade View: Grade I Tube type: Oral Tube size: 7.5 mm Number of attempts: 1 Airway Equipment and Method: Stylet and LTA kit utilized Placement Confirmation: ETT inserted through vocal cords under direct vision,  positive ETCO2 and breath sounds checked- equal and bilateral Secured at: 22 cm Tube secured with: Tape Dental Injury: Teeth and Oropharynx as per pre-operative assessment

## 2014-12-18 NOTE — Op Note (Signed)
[facility] [address] [place], [address]   ENDOSCOPY PROCEDURE REPORT  PATIENT: [name], Miguel  MR#: [id number] BIRTHDATE: 09/03/1990 , 24  yrs. old GENDER: male ENDOSCOPIST: Afsa Meany, MD REFERRED BY:  [facility] PROCEDURE DATE:  12/18/2014 PROCEDURE:  Savary dilation of esophagus ASA CLASS:     Class II INDICATIONS:  dysphagia. MEDICATIONS: Monitored anesthesia care TOPICAL ANESTHETIC: none  DESCRIPTION OF PROCEDURE: After the risks benefits and alternatives of the procedure were thoroughly explained, informed consent was obtained.  The PENTAX GASTOROSCOPE [id number] endoscope was introduced through the mouth and advanced to the second portion of the duodenum , Without limitations.  The instrument was slowly withdrawn as the mucosa was fully examined.   Proximal esophagus mucosa appeared to be healing, tight stricture at 26 cm from incisors unable to traverse with a pedi upper endoscope, savary guidewire was passed under fluoroscopy. serial dilation over guidewire with Savary dilators was performed starting at 5 mm-6mm-7mm- upto 8 mm, subsequently was able to pass the pediatric upper endoscope.  Noted heme in the esophagus with no visible large tearsDiffuse gastric erythema no ulceration. Duodenum appeared normal.  Retroflexed views revealed as previously described.     The scope was then withdrawn from the patient and the procedure completed.  COMPLICATIONS: There were no immediate complications.  ENDOSCOPIC IMPRESSION: Long esophageal stricture starting at 26cm sSev29mKelton 8De9024 [place] 706-32PresBoulde55mKe13mKeltonBroo31Mount7mKel6mKelton 1De479 [place] SSpri38mKelton 2De206 WestPik48mKelton 3De61 W. RidgB59mKeltoBroo4mKelMo61mKelton 7De646 Gl3652mK74mKelton 9De9167mKelton 2De6 [place]9mKelton 8De15 Lind403-08[facility]6-[facility] ting) twice daily Sucralfate 1gm four times a daily Ok to discharge home tomorrow    REPEAT EXAM: 2-4 weeks (Patient will need to be scheduled at [facility] with MAC, intubation and savary dilators)  eSigned:  Clemmie Marxen, MD 12/18/2014  12:55 PM      PATIENT NAME:  [name], Miguel MR#: [id number]

## 2014-12-18 NOTE — Progress Notes (Signed)
PROGRESS NOTE  Miguel RepressJuan [name] JYN:[id number]:[id number] DOB: 10/04/1990 DOA: 12/05/2014 PCP: No primary care provider on file.  HPI/Recap of past 8124 hours: 24 year old transgender who identifies as male admitted on 11/5 after complaining of nausea and vomiting plus confusion. Initially thought to have serotonin syndrome. She apparently had ingested specialized herbal tea that someone had given her and with failure to improve, caustic injury to the esophagus was suspected. EGD done by GI noted several ulcerations and stricture and CT noted extensive esophageal injury but no frank perforation. Patient underwent bronchoscopy on 11/10 with clear airway and no evidence of chemical burns. Patient seen by CT surgery as well. Started on empiric coverage of her vancomycin and Rocephin given fever. Surgery evaluated for surgical feeding tube, but patient hesitant for feeding tube. Started on TPN. Diet has been slowly advanced to clear and full liquids. Patient underwent EGD on 11/18 noting improving ulcerations in the esophagus, diffuse gastritis and lower esophageal stricture which required dilatation. Postprocedure, patient complains of pain on swallowing.    Assessment/Plan:   Toxic effect of other ingested (parts of) plant(s), undetermined, initial encounter   Major depressive disorder, recurrent episode (HCC): Resume Zoloft following endoscopy  Ingestion of caustic substance causing injury to esophagus, dysphagia and initial toxic metabolic encephalopathy: As above. Diet slowly being advanced. Encephalopathy resolved.EGD notes improvement and healing. Now on Carafate and PPI    Code Status: Full code  Family Communication:  friends at the bedside   Disposition Plan: Anticipate discharge tomorrow   Consultants:  Critical care  GI  Cardiothoracic surgery  General surgery  Procedures:  Bronchoscopy done 11/10  Endoscopy done 11/10 & 11/18  Antibiotics:  IV Rocephin and 11/11-11/18  IV  vancomycin 11/11-11/18   Objective: BP 121/68 mmHg  Pulse 87  Temp(Src) 98.3 F (36.8 C) (Oral)  Resp 18  Ht 5\' 9"  (1.753 m)  Wt 74.8 kg (164 lb 14.5 oz)  BMI 24.34 kg/m2  SpO2 99%  Intake/Output Summary (Last 24 hours) at 12/18/14 1701 Last data filed at 12/18/14 1317  Gross per 24 hour  Intake 2408.32 ml  Output      3 ml  Net 2405.32 ml   Filed Weights   12/06/14 0200 12/07/14 1644  Weight: 75.2 kg (165 lb 12.6 oz) 74.8 kg (164 lb 14.5 oz)    Exam:   General:  somnolent, course   Cardiovascular: Regular rate and rhythm, S1-S2  Respiratory: Clear to auscultation bilaterally  Abdomen: Soft, nontender, nondistended, positive bowel sounds  Musculoskeletal: Clubbing cyanosis or edema   Data Reviewed: Basic Metabolic Panel:  Recent Labs Lab 12/13/14 0520 12/14/14 0525 12/15/14 0532 12/16/14 0449 12/17/14 0453  NA 138 140 138 137 139  K 3.2* 2.9* 3.5 4.1 4.4  CL 103 105 106 102 103  CO2 26 27 26 29 30   GLUCOSE 116* 103* 90 107* 106*  BUN 6 9 12 14 13   CREATININE 0.63 0.57* 0.64 0.62 0.67  CALCIUM 8.6* 8.7* 8.5* 8.9 9.1  MG 1.7 2.1 2.1 2.2 2.2  PHOS 3.6 4.7*  --   --  4.6   Liver Function Tests:  Recent Labs Lab 12/12/14 0600 12/13/14 0520 12/14/14 0525 12/17/14 0453  AST 17 13* 14* 27  ALT 16* 13* 11* 34  ALKPHOS 61 49 48 56  BILITOT 0.6 0.4 0.2* 0.4  PROT 6.5 5.8* 6.0* 6.9  ALBUMIN 2.9* 2.6* 2.8* 3.1*   No results for input(s): LIPASE, AMYLASE in the last 168 hours. No results for input(s): AMMONIA  in the last 168 hours. CBC:  Recent Labs Lab 12/12/14 0600 12/13/14 0520  WBC 6.7 5.9  HGB 11.9* 11.0*  HCT 36.3* 33.0*  MCV 90.3 88.7  PLT 353 344   Cardiac Enzymes:   No results for input(s): CKTOTAL, CKMB, CKMBINDEX, TROPONINI in the last 168 hours. BNP (last 3 results) No results for input(s): BNP in the last 8760 hours.  ProBNP (last 3 results) No results for input(s): PROBNP in the last 8760 hours.  CBG:  Recent Labs Lab  12/15/14 1729 12/15/14 2354 12/16/14 0555 12/16/14 1200 12/16/14 1806  GLUCAP 102* 113* 104* 110* 92    Recent Results (from the past 240 hour(s))  Culture, blood (routine x 2)     Status: None   Collection Time: 12/09/14  2:50 PM  Result Value Ref Range Status   Specimen Description BLOOD RIGHT ANTECUBITAL  Final   Special Requests BOTTLES DRAWN AEROBIC AND ANAEROBIC 10CC   Final   Culture NO GROWTH 5 DAYS  Final   Report Status 12/14/2014 FINAL  Final  Culture, blood (routine x 2)     Status: None   Collection Time: 12/09/14  3:00 PM  Result Value Ref Range Status   Specimen Description BLOOD LEFT HAND  Final   Special Requests BOTTLES DRAWN AEROBIC ONLY  10CC  Final   Culture NO GROWTH 5 DAYS  Final   Report Status 12/14/2014 FINAL  Final  Urine culture     Status: None   Collection Time: 12/09/14  6:45 PM  Result Value Ref Range Status   Specimen Description URINE, CLEAN CATCH  Final   Special Requests NONE  Final   Culture 60,000 COLONIES/ml LACTOBACILLUS SPECIES  Final   Report Status 12/11/2014 FINAL  Final  MRSA PCR Screening     Status: None   Collection Time: 12/10/14  1:03 PM  Result Value Ref Range Status   MRSA by PCR NEGATIVE NEGATIVE Final    Comment:        The GeneXpert MRSA Assay (FDA approved for NASAL specimens only), is one component of a comprehensive MRSA colonization surveillance program. It is not intended to diagnose MRSA infection nor to guide or monitor treatment for MRSA infections.      Studies: Dg Esophagus Dilatation  12/18/2014  CLINICAL DATA:  ESOPHAGEAL DILATATION Fluoroscopy was provided for use by the requesting physician.  No images were obtained for radiographic interpretation.    Scheduled Meds: . enoxaparin (LOVENOX) injection  40 mg Subcutaneous Q24H  . famotidine  20 mg Oral BID  . feeding supplement (PRO-STAT SUGAR FREE 64)  30 mL Oral TID  . multivitamin  5 mL Oral Daily  . sucralfate  1 g Oral TID WC & HS     Continuous Infusions: . Marland KitchenTPN (CLINIMIX-E) Adult 83 mL/hr at 12/17/14 1724   And  . fat emulsion 240 mL (12/17/14 1724)  . Marland KitchenTPN (CLINIMIX-E) Adult     And  . fat emulsion    . sodium chloride 10 mL/hr (12/18/14 0904)     Time spent: 15 minutes  Hollice Espy  [facility] Pager [phone]. If 7PM-7AM, please contact night-coverage at [email or web address], password [facility] 12/18/2014, 5:01 PM  LOS: 12 days

## 2014-12-18 NOTE — Transfer of Care (Signed)
Immediate Anesthesia Transfer of Care Note  Patient: Miguel [name]  Procedure(s) Performed: Procedure(s): ESOPHAGOGASTRODUODENOSCOPY (EGD) (N/A) BALLOON DILATION (N/A)  Patient Location: Endoscopy Unit  Anesthesia Type:General  Level of Consciousness: awake and alert   Airway & Oxygen Therapy: Patient Spontanous Breathing and Patient connected to nasal cannula oxygen  Post-op Assessment: Report given to RN and Post -op Vital signs reviewed and stable  Post vital signs: Reviewed and stable  Last Vitals:  Filed Vitals:   12/18/14 1243  BP: 138/76  Pulse: 117  Temp: 36.8 C  Resp: 16    Complications: No apparent anesthesia complications

## 2014-12-18 NOTE — Anesthesia Preprocedure Evaluation (Signed)
Anesthesia Evaluation  Patient identified by MRN, date of birth, ID band Patient awake    Reviewed: Allergy & Precautions, H&P , NPO status , Patient's Chart, lab work & pertinent test results  History of Anesthesia Complications Negative for: history of anesthetic complications  Airway Mallampati: II  TM Distance: >3 FB Neck ROM: full   Comment: Lip ring Dental no notable dental hx. (+) Teeth Intact, Dental Advisory Given   Pulmonary neg pulmonary ROS,    Pulmonary exam normal breath sounds clear to auscultation       Cardiovascular Exercise Tolerance: Good negative cardio ROS Normal cardiovascular exam Rhythm:regular Rate:Normal     Neuro/Psych PSYCHIATRIC DISORDERS Depression Major depressive disordernegative neurological ROS     GI/Hepatic (+)     substance abuse  , Esophageal stricture   Endo/Other  negative endocrine ROS  Renal/GU negative Renal ROS     Musculoskeletal negative musculoskeletal ROS (+)   Abdominal   Peds  Hematology  (+) HIV,   Anesthesia Other Findings Has recently had serotonin syndrome, treated for  Transgender with name now of [name]   Reproductive/Obstetrics negative OB ROS                             Anesthesia Physical Anesthesia Plan  ASA: II  Anesthesia Plan: General   Post-op Pain Management:    Induction: Intravenous  Airway Management Planned: Oral ETT  Additional Equipment:   Intra-op Plan:   Post-operative Plan: Extubation in OR  Informed Consent: I have reviewed the patients History and Physical, chart, labs and discussed the procedure including the risks, benefits and alternatives for the proposed anesthesia with the patient or authorized representative who has indicated his/her understanding and acceptance.   Dental Advisory Given  Plan Discussed with: Anesthesiologist, CRNA and Surgeon  Anesthesia Plan Comments:  (Risks/benefits of general anesthesia discussed with patient including risk of damage to teeth, lips, gum, and tongue, nausea/vomiting, allergic reactions to medications, and the possibility of heart attack, stroke and death.  All patient questions answered.  Patient wishes to proceed.)        Anesthesia Quick Evaluation

## 2014-12-18 NOTE — Anesthesia Postprocedure Evaluation (Signed)
  Anesthesia Post-op Note  Patient: Miguel RepressJuan Verdun  Procedure(s) Performed: Procedure(s) (LRB): ESOPHAGOGASTRODUODENOSCOPY (EGD) (N/A) BALLOON DILATION (N/A)  Patient Location: PACU  Anesthesia Type: General  Level of Consciousness: awake and alert   Airway and Oxygen Therapy: Patient Spontanous Breathing  Post-op Pain: mild  Post-op Assessment: Post-op Vital signs reviewed, Patient's Cardiovascular Status Stable, Respiratory Function Stable, Patent Airway and No signs of Nausea or vomiting  Last Vitals:  Filed Vitals:   12/18/14 1412  BP: 121/68  Pulse: 87  Temp:   Resp: 18    Post-op Vital Signs: stable   Complications: No apparent anesthesia complications

## 2014-12-19 LAB — BASIC METABOLIC PANEL
ANION GAP: 6 (ref 5–15)
BUN: 13 mg/dL (ref 6–20)
CALCIUM: 8.9 mg/dL (ref 8.9–10.3)
CO2: 32 mmol/L (ref 22–32)
Chloride: 101 mmol/L (ref 101–111)
Creatinine, Ser: 0.67 mg/dL (ref 0.61–1.24)
Glucose, Bld: 112 mg/dL — ABNORMAL HIGH (ref 65–99)
POTASSIUM: 3.8 mmol/L (ref 3.5–5.1)
Sodium: 139 mmol/L (ref 135–145)

## 2014-12-19 LAB — CBC
HEMATOCRIT: 36.2 % — AB (ref 39.0–52.0)
HEMOGLOBIN: 11.7 g/dL — AB (ref 13.0–17.0)
MCH: 29.7 pg (ref 26.0–34.0)
MCHC: 32.3 g/dL (ref 30.0–36.0)
MCV: 91.9 fL (ref 78.0–100.0)
Platelets: 380 10*3/uL (ref 150–400)
RBC: 3.94 MIL/uL — AB (ref 4.22–5.81)
RDW: 12.9 % (ref 11.5–15.5)
WBC: 6.4 10*3/uL (ref 4.0–10.5)

## 2014-12-19 MED ORDER — LANSOPRAZOLE 3 MG/ML SUSP: 30.0000 mg | Freq: Two times a day (BID) | ORAL | Status: DC

## 2014-12-19 MED ORDER — PANTOPRAZOLE SODIUM 40 MG PO PACK
40.0000 mg | PACK | Freq: Two times a day (BID) | ORAL | Status: DC
Start: 2014-12-19 — End: 2014-12-20
  Administered 2014-12-19 – 2014-12-20 (×3): 40 mg
  Filled 2014-12-19 (×2): qty 20

## 2014-12-19 MED ORDER — FAMOTIDINE 40 MG/5ML PO SUSR: 20.0000 mg | Freq: Two times a day (BID) | ORAL | Status: DC | PRN

## 2014-12-19 NOTE — Progress Notes (Signed)
[facility] ROUNDING NOTE   Subjective: C/o some pain with swallowing but improving compared yesterday s/p esophageal dilation. Tolerating clear liquids.    Objective: Vital signs in last 24 hours: Temp:  [98 F (36.7 C)-100.2 F (37.9 C)] 98.1 F (36.7 C) (11/19 0523) Pulse Rate:  [71-117] 71 (11/19 0523) Resp:  [11-18] 18 (11/19 0523) BP: (103-138)/(62-76) 110/63 mmHg (11/19 0523) SpO2:  [95 %-100 %] 98 % (11/19 0523) Last BM Date: 12/19/14 General: NAD Lungs: b/l clear Heart: s1s2, no murmurs Abdomen: soft, NTND Ext: no edema    Intake/Output from previous day: 11/18 0701 - 11/19 0700 In: 500 [I.V.:500] Out: -  Intake/Output this shift:     Lab Results:  Recent Labs  12/19/14 0445  WBC 6.4  HGB 11.7*  PLT 380  MCV 91.9   BMET  Recent Labs  12/17/14 0453 12/19/14 0445  NA 139 139  K 4.4 3.8  CL 103 101  CO2 30 32  GLUCOSE 106* 112*  BUN 13 13  CREATININE 0.67 0.67  CALCIUM 9.1 8.9   LFT  Recent Labs  12/17/14 0453  PROT 6.9  ALBUMIN 3.1*  AST 27  ALT 34  ALKPHOS 56  BILITOT 0.4   PT/INR No results for input(s): INR in the last 72 hours.    Imaging/Other results: Dg Esophagus Dilatation  12/18/2014  CLINICAL DATA:  ESOPHAGEAL DILATATION Fluoroscopy was provided for use by the requesting physician.  No images were obtained for radiographic interpretation.      Assessment   24 yr transgender admitted s/p caustic ingestion, noted to have severe esophageal caustic injury with pan esophageal ulceration on 11/10, repeat EGD yesterday with some mucosal healing and dilated the long esophageal stricture to 8mm with Savary dilators.    Plan  Advance as tolerated to full liquid/pureed diet Ok to discharge home once patient is able to tolerate pureed diet and is able to meet calorie requirement off TPN I favor against discharging patient on TPN given the risk for infection and also h/o drug abuse and if patient is unable  to meet calorie requirement will have to readdress enteral feeding tube placement.  Will arrange for repeat EGD with esophageal dilation in 2-4 weeks Please call with any questions  Miguel [name] , MD [phone] Mon-Fri 8a-5p [phone] after 5p, weekends, holidays [facility]

## 2014-12-19 NOTE — Progress Notes (Signed)
PROGRESS NOTE  Miguel RepressJuan Gear [name]:[id number]:[id number] DOB: 11/12/1990 DOA: 12/05/2014 PCP: No primary care provider on file.  HPI/Recap of past 6324 hours: [age] year old transgender who identifies as male admitted on 11/5 after complaining of nausea and vomiting plus confusion. Initially thought to have serotonin syndrome. She apparently had ingested specialized herbal tea that someone had given her and with failure to improve, caustic injury to the esophagus was suspected. EGD done by GI noted several ulcerations and stricture and CT noted extensive esophageal injury but no frank perforation. Patient underwent bronchoscopy on 11/10 with clear airway and no evidence of chemical burns. Patient seen by CT surgery as well. Started on empiric coverage of her vancomycin and Rocephin given fever. Surgery evaluated for surgical feeding tube, but patient hesitant for feeding tube. Started on TPN. Diet has been slowly advanced to clear and full liquids. Patient underwent EGD on 11/18 noting improving ulcerations in the esophagus, diffuse gastritis and lower esophageal stricture which required dilatation.   Patient doing better today, less pain. Swallowing easier  Assessment/Plan:   Toxic effect of other ingested (parts of) plant(s), undetermined, initial encounter   Major depressive disorder, recurrent episode (HCC): Resume Zoloft following endoscopy  Ingestion of caustic substance causing injury to esophagus, dysphagia and initial toxic metabolic encephalopathy: As above. Diet slowly being advanced. Encephalopathy resolved.EGD notes improvement and healing. Now on Carafate and PPI.  Discontinuing TPN    Code Status: Full code  Family Communication: No family present  Disposition Plan: Home tomorrow   Consultants:  Critical care  GI  Cardiothoracic surgery  General surgery  Procedures:  Bronchoscopy done 11/10  Endoscopy done 11/10 & 11/18  Antibiotics:  IV Rocephin and 11/11-11/18  IV  vancomycin 11/11-11/18   Objective: BP 110/63 mmHg  Pulse 71  Temp(Src) 98.1 F (36.7 C) (Oral)  Resp 18  Ht 5\' 9"  (1.753 m)  Wt 74.8 kg (164 lb 14.5 oz)  BMI 24.34 kg/m2  SpO2 98%  Intake/Output Summary (Last 24 hours) at 12/19/14 1516 Last data filed at 12/19/14 [id number]  Gross per 24 hour  Intake      0 ml  Output      0 ml  Net      0 ml   Filed Weights   12/06/14 0200 12/07/14 1644  Weight: 75.2 kg (165 lb 12.6 oz) 74.8 kg (164 lb 14.5 oz)    Exam:   General:  Alert and oriented 3, no acute distress  Cardiovascular: Regular rate and rhythm, S1-S2  Respiratory: Clear to auscultation bilaterally  Abdomen: Soft, nontender, nondistended, positive bowel sounds  Musculoskeletal: No edema   Data Reviewed: Basic Metabolic Panel:  Recent Labs Lab 12/13/14 0520 12/14/14 0525 12/15/14 0532 12/16/14 0449 12/17/14 0453 12/19/14 0445  NA 138 140 138 137 139 139  K 3.2* 2.9* 3.5 4.1 4.4 3.8  CL 103 105 106 102 103 101  CO2 26 27 26 29 30  32  GLUCOSE 116* 103* 90 107* 106* 112*  BUN 6 9 12 14 13 13   CREATININE 0.63 0.57* 0.64 0.62 0.67 0.67  CALCIUM 8.6* 8.7* 8.5* 8.9 9.1 8.9  MG 1.7 2.1 2.1 2.2 2.2  --   PHOS 3.6 4.7*  --   --  4.6  --    Liver Function Tests:  Recent Labs Lab 12/13/14 0520 12/14/14 0525 12/17/14 0453  AST 13* 14* 27  ALT 13* 11* 34  ALKPHOS 49 48 56  BILITOT 0.4 0.2* 0.4  PROT 5.8* 6.0* 6.9  ALBUMIN 2.6*  2.8* 3.1*   No results for input(s): LIPASE, AMYLASE in the last 168 hours. No results for input(s): AMMONIA in the last 168 hours. CBC:  Recent Labs Lab 12/13/14 0520 12/19/14 0445  WBC 5.9 6.4  HGB 11.0* 11.7*  HCT 33.0* 36.2*  MCV 88.7 91.9  PLT 344 380   Cardiac Enzymes:   No results for input(s): CKTOTAL, CKMB, CKMBINDEX, TROPONINI in the last 168 hours. BNP (last 3 results) No results for input(s): BNP in the last 8760 hours.  ProBNP (last 3 results) No results for input(s): PROBNP in the last 8760  hours.  CBG:  Recent Labs Lab 12/15/14 1729 12/15/14 2354 12/16/14 0555 12/16/14 1200 12/16/14 1806  GLUCAP 102* 113* 104* 110* 92    Recent Results (from the past 240 hour(s))  Urine culture     Status: None   Collection Time: 12/09/14  6:45 PM  Result Value Ref Range Status   Specimen Description URINE, CLEAN CATCH  Final   Special Requests NONE  Final   Culture 60,000 COLONIES/ml LACTOBACILLUS SPECIES  Final   Report Status 12/11/2014 FINAL  Final  MRSA PCR Screening     Status: None   Collection Time: 12/10/14  1:03 PM  Result Value Ref Range Status   MRSA by PCR NEGATIVE NEGATIVE Final    Comment:        The GeneXpert MRSA Assay (FDA approved for NASAL specimens only), is one component of a comprehensive MRSA colonization surveillance program. It is not intended to diagnose MRSA infection nor to guide or monitor treatment for MRSA infections.      Studies: No results found.  Scheduled Meds: . enoxaparin (LOVENOX) injection  40 mg Subcutaneous Q24H  . feeding supplement (PRO-STAT SUGAR FREE 64)  30 mL Oral TID  . multivitamin  5 mL Oral Daily  . pantoprazole sodium  40 mg Per Tube BID  . sucralfate  1 g Oral TID WC & HS    Continuous Infusions: . Marland KitchenTPN (CLINIMIX-E) Adult 83 mL/hr at 12/18/14 1714   And  . fat emulsion 240 mL (12/18/14 1714)  . sodium chloride 10 mL/hr (12/18/14 0904)     Time spent: 10 minutes  Hollice Espy  [facility] Pager [phone]. If 7PM-7AM, please contact night-coverage at [email or web address], password [facility] 12/19/2014, 3:16 PM  LOS: 13 days

## 2014-12-20 MED ORDER — OMEPRAZOLE MAGNESIUM 20 MG PO TBEC: 20.0000 mg | DELAYED_RELEASE_TABLET | Freq: Two times a day (BID) | ORAL | Status: DC

## 2014-12-20 MED ORDER — SUCRALFATE 1 GM/10ML PO SUSP: 1.0000 g | Freq: Three times a day (TID) | ORAL | Status: DC

## 2014-12-20 MED ORDER — PRO-STAT SUGAR FREE PO LIQD: 30.0000 mL | Freq: Three times a day (TID) | ORAL | Status: AC

## 2014-12-20 MED ORDER — ADULT MULTIVITAMIN LIQUID CH: 5.0000 mL | Freq: Every day | ORAL | Status: AC

## 2014-12-20 MED ORDER — MORPHINE SULFATE (CONCENTRATE) 10 MG/0.5ML PO SOLN: 5.0000 mg | ORAL | Status: DC | PRN

## 2014-12-20 NOTE — Progress Notes (Signed)
CM spoke to patient who is uninsured at the time with medicaid application just submitted so is pending. CM provided MATCH letter to patient and explained the program/$3 cost of medications as well as a list of participating pharmacies. Patient denies further needs at this time and feels that she will be able to afford the copay for the medications. Please refer back to CM with additional questions.

## 2014-12-20 NOTE — Care Management Note (Signed)
Case Management Note  Patient Details  Name: Arlyss RepressJuan Overacker MRN: [id number] Date of Birth: 01/10/1991  Subjective/Objective:                  Chief Complaint: Nausea and Vomiting and Palpitations  Action/Plan: CM spoke to patient at the bedside. Patient has visitor with her at the bedside. CM spoke to patient about needs and patient states that she has primary care provider through [facility] [place], [place]. Patient states that she has applied for medicaid. Medications PTA were affordable per patient [facility]. CM will follow for possible medication needs depending on what patient is discharged home on. [facility] and said that she lives with roommates but also has friends to support as well after discharge.   Expected Discharge Date:  12/20/14               Expected Discharge Plan:  Home/Self Care  In-House Referral:  Clinical Social Work  Discharge planning Services  CM Consult  Post Acute Care Choice:    Choice offered to:     DME Arranged:    DME Agency:     HH Arranged:    HH Agency:     Status of Service:  In process, will continue to follow  Medicare Important Message Given:    Date Medicare IM Given:    Medicare IM give by:    Date Additional Medicare IM Given:    Additional Medicare Important Message give by:     If discussed at Long Length of Stay Meetings, dates discussed:    Additional Comments:  Darcel SmallingAnna C Zaylin Pistilli, RN 12/20/2014, 11:12 AM

## 2014-12-20 NOTE — Progress Notes (Signed)
NURSING PROGRESS NOTE  Miguel [name] [id number] Discharge Data: 12/20/2014 1:13 PM Attending Provider: Hollice EspySendil K Krishnan, MD PCP:No primary care provider on file.     Miguel [name] to be D/C'd Home per MD order.  Discussed with the patient the After Visit Summary and all questions fully answered. All IV's discontinued with no bleeding noted. All belongings returned to patient for patient to take home.   Last Vital Signs:  Blood pressure 105/50, pulse 79, temperature 98.4 F (36.9 C), temperature source Oral, resp. rate 18, height 5\' 9"  (1.753 m), weight 74.8 kg (164 lb 14.5 oz), SpO2 99 %.  Discharge Medication List   Medication List    TAKE these medications        estradiol 1 MG tablet  Commonly known as:  ESTRACE  Take 2.5 mg by mouth 2 (two) times daily.     feeding supplement (PRO-STAT SUGAR FREE 64) Liqd  Take 30 mLs by mouth 3 (three) times daily.     morphine CONCENTRATE 10 MG/0.5ML Soln concentrated solution  Take 0.25 mLs (5 mg total) by mouth every 4 (four) hours as needed for severe pain.     multivitamin Liqd  Take 5 mLs by mouth daily.     omeprazole 20 MG tablet  Commonly known as:  PRILOSEC OTC  Take 1 tablet (20 mg total) by mouth 2 (two) times daily.     sertraline 100 MG tablet  Commonly known as:  ZOLOFT  Take 100 mg by mouth daily.     spironolactone 100 MG tablet  Commonly known as:  ALDACTONE  Take 100 mg by mouth 2 (two) times daily.     sucralfate 1 GM/10ML suspension  Commonly known as:  CARAFATE  Take 10 mLs (1 g total) by mouth 4 (four) times daily -  with meals and at bedtime.         Bennie Pieriniyndi Rajan Burgard, RN

## 2014-12-20 NOTE — Discharge Summary (Signed)
Discharge Summary  Jeffry Vogelsang WUX:[id number] DOB: 01-25-1991  PCP: [place] Unable to remember his name. Unable to access records on Epic Admit date: 12/05/2014 Discharge date: 12/20/2014  Time spent: 25 minutes  Recommendations for Outpatient Follow-up:  1. Patient will follow-up with gastroenterology in the next 2-3 weeks 2. New medication: Feeding supplement ProStep by mouth 3 times a day 3. New medication: Liquid multivitamin 5 ML by mouth daily 4. New medication: Prilosec 20 mg by mouth twice a day 5. New medication: Carafate solution 1 g by mouth 4 times a day 6. New medication: Roxanol 5 mg solution by mouth 4 times a day when necessary for pain  Discharge Diagnoses:  Active Hospital Problems   Diagnosis Date Noted  . 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 Esophageal stricture   . Ingestion of caustic substance   . Dysphagia   . Odynophagia   . Altered mental status   . Acute on chronic respiratory failure (HCC)   . FUO (fever of unknown origin) 12/09/2014  . Toxic effect of other ingested (parts of) plant(s), undetermined, initial encounter 12/06/2014  . Major depressive disorder, recurrent episode (HCC) 12/06/2014    Resolved Hospital Problems   Diagnosis Date Noted Date Resolved  No resolved problems to display.    Discharge Condition: Improved, being discharged home  Diet recommendation: Pured regular diet, advance as tolerated  Filed Weights   12/06/14 0200 12/07/14 1644  Weight: 75.2 kg (165 lb 12.6 oz) 74.8 kg (164 lb 14.5 oz)    History of present illness:  24 year old transgender who identifies as male admitted on 11/5 after complaining of nausea and vomiting plus confusion. Initially thought to have serotonin syndrome. She apparently had ingested specialized herbal tea that someone had given her and with failure to improve, caustic injury to the esophagus was suspected. EGD done by GI noted several ulcerations and stricture and CT  noted extensive esophageal injury but no frank perforation.  Hospital Course:  Active Problems:   Toxic effect of other ingested (parts of) plant(s), undetermined, sequelae/ingestion of caustic substance sequela causing initial metabolic encephalopathy, dysphagia and odynophagia plus esophageal stricture, gastritis: Patient underwent bronchoscopy on 11/10 with clear airway and no evidence of chemical burns. Patient seen by CT surgery as well. Started on empiric coverage of her vancomycin and Rocephin given fever. Encephalopathy resolved. Surgery evaluated for surgical feeding tube, but patient hesitant for feeding tube. Started on TPN. Diet has been slowly advanced to clear and full liquids. Patient on full course of antibiotics during hospitalization.  Patient underwent EGD on 11/18 noting improving ulcerations in the esophagus, diffuse gastritis and lower esophageal stricture which required dilatation. Diet was able to be advanced and by 11/20 felt to be stable for discharge. Outpatient follow-up with GI in 2-3 weeks. Discharged on PPI for her gastritis, Carafate to improve healing of esophageal ulcerations. Feeding supplement to improve calorie intake    Major depressive disorder, recurrent episode [facility]): Continued on Zoloft  Consultants:  Critical care  GI  Cardiothoracic surgery  General surgery  Procedures:  Bronchoscopy done 11/10  Endoscopy done 11/10 & 11/18  Discharge Exam: BP 105/50 mmHg  Pulse 79  Temp(Src) 98.4 F (36.9 C) (Oral)  Resp 18  Ht  (1.753 m)  Wt 74.8 kg (164 lb 14.5 oz)  BMI 24.34 kg/m2  SpO2 99%  General: Alert and oriented 3 Cardiovascular: Regular rate and rhythm, S1-S2 Respiratory: Clear to auscultation bilaterally  Discharge Instructions You were cared for  by a hospitalist during your hospital stay. If you have any questions about your discharge medications or the care you received while you were in the hospital after you are discharged, you  can call the unit and asked to speak with the hospitalist on call if the hospitalist that took care of you is not available. Once you are discharged, your primary care physician will handle any further medical issues. Please note that NO REFILLS for any discharge medications will be authorized once you are discharged, as it is imperative that you return to your primary care physician (or establish a relationship with a primary care physician if you do not have one) for your aftercare needs so that they can reassess your need for medications and monitor your lab values.  Discharge Instructions    Diet - low sodium heart healthy    Complete by:  As directed      Increase activity slowly    Complete by:  As directed             Medication List    TAKE these medications        estradiol 1 MG tablet  Commonly known as:  ESTRACE  Take 2.5 mg by mouth 2 (two) times daily.     feeding supplement (PRO-STAT SUGAR FREE 64) Liqd  Take 30 mLs by mouth 3 (three) times daily.     morphine CONCENTRATE 10 MG/0.5ML Soln concentrated solution  Take 0.25 mLs (5 mg total) by mouth every 4 (four) hours as needed for severe pain.     multivitamin Liqd  Take 5 mLs by mouth daily.     omeprazole 20 MG tablet  Commonly known as:  PRILOSEC OTC  Take 1 tablet (20 mg total) by mouth 2 (two) times daily.     sertraline 100 MG tablet  Commonly known as:  ZOLOFT  Take 100 mg by mouth daily.     spironolactone 100 MG tablet  Commonly known as:  ALDACTONE  Take 100 mg by mouth 2 (two) times daily.     sucralfate 1 GM/10ML suspension  Commonly known as:  CARAFATE  Take 10 mLs (1 g total) by mouth 4 (four) times daily -  with meals and at bedtime.       No Known Allergies     Follow-up Information    Follow up with Marsa Aris, MD. Schedule an appointment as soon as possible for a visit in 2 weeks.   Specialty:  Gastroenterology   Contact information:   [address] [place]  [address] [phone]        The results of significant diagnostics from this hospitalization (including imaging, microbiology, ancillary and laboratory) are listed below for reference.    Significant Diagnostic Studies: Ct Chest W Contrast  12/10/2014  CLINICAL DATA:  Ingestion of caustic substance. Severe mid esophageal stricture on upper endoscopy. EXAM: CT CHEST WITH CONTRAST TECHNIQUE: Multidetector CT imaging of the chest was performed during intravenous contrast administration. CONTRAST:  75mL OMNIPAQUE IOHEXOL 300 MG/ML  SOLN COMPARISON:  12/05/2014 chest radiograph. FINDINGS: Mediastinum/Nodes: Normal heart size. No pericardial fluid/thickening. Normal caliber thoracic aorta. Borderline dilated main pulmonary artery (3.0 cm diameter). No central pulmonary emboli. Normal visualized thyroid. There is severe circumferential wall thickening throughout the entire visualized thoracic esophagus, most severe in the mid to lower thoracic esophagus. Bubbly central gas in the upper thoracic esophagus could represent submucosal pneumatosis. No pneumomediastinum. There is fat stranding and ill-defined fluid surrounding the entire length  of the thoracic esophagus, with no mediastinal fluid collection. Triangular homogeneous soft tissue in the anterior mediastinum is most in keeping with residual thymus. No pathologically enlarged axillary, mediastinal or hilar lymph nodes. Lungs/Pleura: No pneumothorax. Small layering bilateral pleural effusions. Mild passive atelectasis and dependent lower lobes. Mild platelike atelectasis in the lingula. No acute consolidative airspace disease, significant pulmonary nodules or lung masses. Upper abdomen: Unremarkable. Musculoskeletal: No aggressive appearing focal osseous lesions. Moderate symmetric gynecomastia. IMPRESSION: 1. Severe circumferential wall thickening throughout the thoracic esophagus, most severe in the mid to lower thoracic esophagus. Fat stranding and  ill-defined fluid surrounding the entire length of the thoracic esophagus. Findings are most in keeping with acute severe esophagitis. 2. Bubbly central gas in the upper thoracic esophagus could represent submucosal pneumatosis. No pneumomediastinum. No focal mediastinal fluid collection. 3. Small bilateral pleural effusions with mild bibasilar atelectasis. Electronically Signed   By: Delbert Phenix M.D.   On: 12/10/2014 16:52   Dg Esophagus  12/14/2014  CLINICAL DATA:  Ingestion of caustic material. Possible need for enteric feeds. Able to drink water earlier today. Severe mid esophageal stricture on upper endoscopy. EXAM: ESOPHOGRAM/BARIUM SWALLOW TECHNIQUE: Single contrast examination was performed using Omnipaque followed by thin barium liquid and tablet. FLUOROSCOPY TIME:  Fluoroscopy Time:  1 minutes 30 seconds COMPARISON:  CT of the chest on 12/10/2014 FINDINGS: Initially sips of water were swell without difficulty. Subsequently Omnipaque was administered and swallowed without difficulty. There is no evidence for contrast leak. Barium was then administered. There is a long segment esophageal stricture beginning at the level of the aortic arch and extending to the gastroesophageal junction. Barium tablet is transiently at the level of the thoracic inlet. Subsequently barium tablet is upheld for the duration of the study at the level of the aortic arch. The patient is not symptomatic with the upheld barium tablet at this level. IMPRESSION: Long segment lower esophageal stricture, beginning at the level of the aortic arch. The salient findings were discussed with Dr. Rhea Belton on 12/14/2014 at 6:35 pm. Electronically Signed   By: Norva Pavlov M.D.   On: 12/14/2014 18:36   Dg Chest Portable 1 View  12/05/2014  CLINICAL DATA:  Drug overdose. EXAM: PORTABLE CHEST 1 VIEW COMPARISON:  None. FINDINGS: The heart size and mediastinal contours are within normal limits. Both lungs are clear. The visualized skeletal  structures are unremarkable. IMPRESSION: Normal chest x-ray. Electronically Signed   By: Rudie Meyer M.D.   On: 12/05/2014 21:30   Dg Esophagus Dilatation  12/18/2014  CLINICAL DATA:  ESOPHAGEAL DILATATION Fluoroscopy was provided for use by the requesting physician.  No images were obtained for radiographic interpretation.    Microbiology: No results found for this or any previous visit (from the past 240 hour(s)).   Labs: Basic Metabolic Panel:  Recent Labs Lab 12/14/14 0525 12/15/14 0532 12/16/14 0449 12/17/14 0453 12/19/14 0445  NA 140 138 137 139 139  K 2.9* 3.5 4.1 4.4 3.8  CL 105 106 102 103 101  CO2 27 26 29 30  32  GLUCOSE 103* 90 107* 106* 112*  BUN 9 12 14 13 13   CREATININE 0.57* 0.64 0.62 0.67 0.67  CALCIUM 8.7* 8.5* 8.9 9.1 8.9  MG 2.1 2.1 2.2 2.2  --   PHOS 4.7*  --   --  4.6  --    Liver Function Tests:  Recent Labs Lab 12/14/14 0525 12/17/14 0453  AST 14* 27  ALT 11* 34  ALKPHOS 48 56  BILITOT 0.2*  0.4  PROT 6.0* 6.9  ALBUMIN 2.8* 3.1*   No results for input(s): LIPASE, AMYLASE in the last 168 hours. No results for input(s): AMMONIA in the last 168 hours. CBC:  Recent Labs Lab 12/19/14 0445  WBC 6.4  HGB 11.7*  HCT 36.2*  MCV 91.9  PLT 380   Cardiac Enzymes: No results for input(s): CKTOTAL, CKMB, CKMBINDEX, TROPONINI in the last 168 hours. BNP: BNP (last 3 results) No results for input(s): BNP in the last 8760 hours.  ProBNP (last 3 results) No results for input(s): PROBNP in the last 8760 hours.  CBG:  Recent Labs Lab 12/15/14 1729 12/15/14 2354 12/16/14 0555 12/16/14 1200 12/16/14 1806  GLUCAP 102* 113* 104* 110* 92       Signed:  Maybel Dambrosio K  [facility] 12/20/2014, 3:35 PM

## 2014-12-21 ENCOUNTER — Encounter (HOSPITAL_COMMUNITY): Payer: Self-pay | Admitting: Gastroenterology

## 2014-12-21 ENCOUNTER — Telehealth: Payer: Self-pay

## 2014-12-21 ENCOUNTER — Encounter (HOSPITAL_COMMUNITY): Payer: Self-pay | Admitting: Clinical

## 2014-12-21 ENCOUNTER — Other Ambulatory Visit: Payer: Self-pay

## 2014-12-21 DIAGNOSIS — K222 Esophageal obstruction: Secondary | ICD-10-CM

## 2014-12-21 NOTE — Telephone Encounter (Signed)
I have left message for the patient to call back.  Letter coming in the mail with procedure information. Patient is scheduled for EGD with savory dilators and intubation for anesthesia at [facility]. Procedure date and time is 01/18/15 at 9:30. Arrive 8:00 am unless Endoscopy department calls with a different time. No DPR.

## 2014-12-21 NOTE — Telephone Encounter (Signed)
-----   Message from Napoleon FormKavitha Nandigam V, MD sent at 12/18/2014  5:20 PM EST ----- Please schedule for [facility] in about 2-4 weeks with MAC and will need to be intubated for the procedure. I will be doing the procedure and She will need follow up with Dr Adela LankArmbruster in office visit after EGD

## 2015-01-01 ENCOUNTER — Telehealth: Payer: Self-pay

## 2015-01-01 NOTE — Telephone Encounter (Signed)
Letter returned as unable to forward. [address]

## 2015-01-05 NOTE — Telephone Encounter (Signed)
Letter returned. No forwarding address. I have left message for the patient to call back again.

## 2015-01-06 NOTE — Telephone Encounter (Signed)
I have been unsuccessful in reaching this patient. No return calls and the letter was returned. I have cancelled the procedure.

## 2015-01-07 NOTE — Telephone Encounter (Signed)
Ok

## 2015-01-09 ENCOUNTER — Emergency Department (HOSPITAL_COMMUNITY)
Admission: EM | Admit: 2015-01-09 | Discharge: 2015-01-09 | Disposition: A | Discharge status: Home / Self Care | Payer: 59 | Attending: Emergency Medicine | Admitting: Emergency Medicine

## 2015-01-09 ENCOUNTER — Emergency Department (HOSPITAL_COMMUNITY): Payer: 59

## 2015-01-09 ENCOUNTER — Encounter (HOSPITAL_COMMUNITY): Payer: Self-pay | Admitting: Emergency Medicine

## 2015-01-09 DIAGNOSIS — R1310 Dysphagia, unspecified: Secondary | ICD-10-CM

## 2015-01-09 DIAGNOSIS — J029 Acute pharyngitis, unspecified: Secondary | ICD-10-CM | POA: Insufficient documentation

## 2015-01-09 DIAGNOSIS — Z79899 Other long term (current) drug therapy: Secondary | ICD-10-CM | POA: Insufficient documentation

## 2015-01-09 DIAGNOSIS — Z8719 Personal history of other diseases of the digestive system: Secondary | ICD-10-CM | POA: Insufficient documentation

## 2015-01-09 DIAGNOSIS — R079 Chest pain, unspecified: Secondary | ICD-10-CM | POA: Diagnosis not present

## 2015-01-09 DIAGNOSIS — F329 Major depressive disorder, single episode, unspecified: Secondary | ICD-10-CM | POA: Insufficient documentation

## 2015-01-09 DIAGNOSIS — Z8669 Personal history of other diseases of the nervous system and sense organs: Secondary | ICD-10-CM | POA: Insufficient documentation

## 2015-01-09 DIAGNOSIS — Z87828 Personal history of other (healed) physical injury and trauma: Secondary | ICD-10-CM | POA: Diagnosis not present

## 2015-01-09 LAB — RAPID URINE DRUG SCREEN, HOSP PERFORMED
Amphetamines: POSITIVE — AB
BENZODIAZEPINES: NOT DETECTED
Barbiturates: NOT DETECTED
COCAINE: NOT DETECTED
Opiates: POSITIVE — AB
Tetrahydrocannabinol: POSITIVE — AB

## 2015-01-09 LAB — COMPREHENSIVE METABOLIC PANEL
ALK PHOS: 63 U/L (ref 38–126)
ALT: 8 U/L — ABNORMAL LOW (ref 17–63)
ANION GAP: 9 (ref 5–15)
AST: 22 U/L (ref 15–41)
Albumin: 3.5 g/dL (ref 3.5–5.0)
BILIRUBIN TOTAL: 1.6 mg/dL — AB (ref 0.3–1.2)
BUN: 6 mg/dL (ref 6–20)
CALCIUM: 9 mg/dL (ref 8.9–10.3)
CO2: 23 mmol/L (ref 22–32)
Chloride: 107 mmol/L (ref 101–111)
Creatinine, Ser: 0.89 mg/dL (ref 0.61–1.24)
GFR calc non Af Amer: >60 mL/min (ref >60)
Glucose, Bld: 90 mg/dL (ref 65–99)
Potassium: 3.7 mmol/L (ref 3.5–5.1)
SODIUM: 139 mmol/L (ref 135–145)
TOTAL PROTEIN: 6.2 g/dL — AB (ref 6.5–8.1)

## 2015-01-09 LAB — CBC WITH DIFFERENTIAL/PLATELET
Basophils Absolute: 0 10*3/uL (ref 0.0–0.1)
Basophils Relative: 1 %
EOS ABS: 0.2 10*3/uL (ref 0.0–0.7)
Eosinophils Relative: 4 %
HEMATOCRIT: 38 % — AB (ref 39.0–52.0)
HEMOGLOBIN: 12.6 g/dL — AB (ref 13.0–17.0)
LYMPHS ABS: 1.4 10*3/uL (ref 0.7–4.0)
Lymphocytes Relative: 24 %
MCH: 29.6 pg (ref 26.0–34.0)
MCHC: 33.2 g/dL (ref 30.0–36.0)
MCV: 89.4 fL (ref 78.0–100.0)
MONO ABS: 0.6 10*3/uL (ref 0.1–1.0)
MONOS PCT: 10 %
NEUTROS PCT: 61 %
Neutro Abs: 3.6 10*3/uL (ref 1.7–7.7)
Platelets: 219 10*3/uL (ref 150–400)
RBC: 4.25 MIL/uL (ref 4.22–5.81)
RDW: 13 % (ref 11.5–15.5)
WBC: 5.8 10*3/uL (ref 4.0–10.5)

## 2015-01-09 LAB — CBG MONITORING, ED: Glucose-Capillary: 76 mg/dL (ref 65–99)

## 2015-01-09 LAB — I-STAT TROPONIN, ED: Troponin i, poc: 0 ng/mL (ref 0.00–0.08)

## 2015-01-09 MED ORDER — OMEPRAZOLE 2 MG/ML ORAL SUSPENSION: 20.0000 mg | Freq: Two times a day (BID) | ORAL | Status: AC

## 2015-01-09 MED ORDER — SODIUM CHLORIDE 0.9 % IV BOLUS (SEPSIS)
1000.0000 mL | Freq: Once | INTRAVENOUS | Status: AC
  Administered 2015-01-09: 1000 mL via INTRAVENOUS

## 2015-01-09 MED ORDER — PROMETHAZINE HCL 25 MG/ML IJ SOLN
25.0000 mg | Freq: Four times a day (QID) | INTRAMUSCULAR | Status: DC | PRN
  Filled 2015-01-09: qty 1

## 2015-01-09 NOTE — ED Notes (Signed)
Patient left at this time with all belongings. 

## 2015-01-09 NOTE — ED Notes (Signed)
Pt. Stated, I had some special herbal tea and it was acidic to my throat and had to get it stretched and fixed.  Now its really sore again, and I wan it checked out.

## 2015-01-09 NOTE — ED Notes (Signed)
Pt still able to swallow fluids, states her throat is "seizing up."

## 2015-01-09 NOTE — Discharge Instructions (Signed)

## 2015-01-09 NOTE — ED Notes (Signed)
CBG 76 

## 2015-01-09 NOTE — ED Provider Notes (Signed)
CSN: [id number]     Arrival date & time 01/09/15  1330 History   First MD Initiated Contact with Patient 01/09/15 1411     Chief Complaint  Patient presents with  . Sore Throat  . 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 Patient states she drank [place] tea about a month ago which caused caustic injury to her esophagus. States she was hospitalized at the time and was supposed to follow up with GI as outpatient. Her GI appointment was on December 4 and then rescheduled for December 19 for EGD and dilation of the esophagus. States for the past 3 days she has been having fevers, chills, body aches, and is feeling tired. In addition, reports having difficulty swallowing and is retching. Reports having pain in her throat and chest area from esophageal spasms. She denies having any nausea but states the retching is causing her to bring up chunks of blood mixed with mucus. Denies having any abdominal pain or diarrhea. No recent sick contacts. Denies trying any new foods. No other complaints.   Patient is a 23 y.o. male presenting with pharyngitis.  Sore Throat Associated symptoms include chills and a fever. Pertinent negatives include no abdominal pain, arthralgias, chest pain, congestion, coughing, nausea, numbness, rash or weakness.    Past Medical History  Diagnosis Date  . Depression   . IBS (irritable bowel syndrome)   . 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 Polysubstance abuse 07/2014  . Depression 07/2014.  Marland Kitchen Serotonin syndrome 07/2014   Past Surgical History  Procedure Laterality Date  . Appendectomy    . Appendectomy  age 60  . Esophagogastroduodenoscopy N/A  12/10/2014    Procedure: ESOPHAGOGASTRODUODENOSCOPY (EGD);  Surgeon: Napoleon Form, MD;  Location: [facility] ENDOSCOPY;  Service: Endoscopy;  Laterality: N/A;  . Esophagogastroduodenoscopy N/A 12/18/2014    Procedure: ESOPHAGOGASTRODUODENOSCOPY (EGD);  Surgeon: Napoleon Form, MD;  Location: [facility] ENDOSCOPY;  Service: Endoscopy;  Laterality: N/A;  . Balloon dilation N/A 12/18/2014    Procedure: BALLOON DILATION;  Surgeon: Napoleon Form, MD;  Location: [facility] ENDOSCOPY;  Service: Endoscopy;  Laterality: N/A;   Family History  Problem Relation Age of Onset  . Paranoid behavior Father    Social History  Substance Use Topics  . Smoking status: Never Smoker   . Smokeless tobacco: None  . Alcohol Use: 0.0 oz/week    0 Standard drinks or equivalent per week     Comment: 2 glass, 2 times a week    Review of Systems  Constitutional: Positive for fever and chills.  HENT: Negative for congestion and rhinorrhea.   Eyes: Negative for pain and discharge.  Respiratory: Positive for choking and chest tightness. Negative for cough, shortness of breath, wheezing and stridor.   Cardiovascular: Negative for chest pain and leg swelling.  Gastrointestinal: Negative for nausea, abdominal pain, diarrhea, constipation, blood in stool and abdominal distention.  Genitourinary: Negative for difficulty urinating.  Musculoskeletal: Negative for arthralgias.  Skin: Negative for rash.  Neurological: Negative for dizziness, weakness, light-headedness and numbness.      Allergies  Review of patient's allergies indicates no known allergies.  Home Medications   Prior to Admission medications   Medication Sig Start Date End Date Taking? Authorizing  Provider  belladonna-PHENObarbital (DONNATAL) 16.2 MG/5ML ELIX Take 5 mLs by mouth 2 (two) times daily as needed for cramping.   Yes Historical Provider, MD  estradiol (ESTRACE) 1 MG tablet Take 2-3 mg by mouth daily. Takes two in the am and thre tablets at night.    Yes Historical Provider, MD  Morphine Sulfate (MORPHINE CONCENTRATE) 10 MG/0.5ML SOLN concentrated solution Take 0.25 mLs (5 mg total) by mouth every 4 (four) hours as needed for severe pain. 12/20/14  Yes Hollice EspySendil K Krishnan, MD  sertraline (ZOLOFT) 100 MG tablet Take 200 mg by mouth daily.  11/22/14  Yes Historical Provider, MD  spironolactone (ALDACTONE) 100 MG tablet Take 100 mg by mouth 2 (two) times daily.   Yes Historical Provider, MD  sucralfate (CARAFATE) 1 GM/10ML suspension Take 10 mLs (1 g total) by mouth 4 (four) times daily -  with meals and at bedtime. 12/20/14  Yes Hollice EspySendil K Krishnan, MD  Amino Acids-Protein Hydrolys (FEEDING SUPPLEMENT, PRO-STAT SUGAR FREE 64,) LIQD Take 30 mLs by mouth 3 (three) times daily. 12/20/14   Hollice EspySendil K Krishnan, MD  Multiple Vitamin (MULTIVITAMIN) LIQD Take 5 mLs by mouth daily. 12/20/14   Hollice EspySendil K Krishnan, MD  omeprazole (PRILOSEC OTC) 20 MG tablet Take 1 tablet (20 mg total) by mouth 2 (two) times daily. 12/20/14   Hollice EspySendil K Krishnan, MD   BP 111/76 mmHg  Pulse 99  Temp(Src) 98.5 F (36.9 C) (Oral)  Resp 16  Ht 5\' 9"  (1.753 m)  Wt 64.91 kg  BMI 21.12 kg/m2  SpO2 98% Physical Exam  Constitutional: He is oriented to person, place, and time. He appears well-developed and well-nourished.  Spitting clear non-bloody saliva in an empty bottle   HENT:  Head: Normocephalic and atraumatic.  Mouth/Throat: Oropharynx is clear and moist.  Eyes: EOM are normal. Pupils are equal, round, and reactive to light.  Neck: Neck supple. No tracheal deviation present.  Cardiovascular: Normal rate, regular rhythm and intact distal pulses.  Exam reveals no gallop and no friction rub.   No murmur heard. Pulmonary/Chest: Effort normal. No respiratory distress. He has no wheezes. He has no rales.  Abdominal: Soft. Bowel sounds are normal. He exhibits no distension. There is no tenderness. There is no rebound and no guarding.  Musculoskeletal: He exhibits no edema.   Neurological: He is alert and oriented to person, place, and time.  Skin: Skin is warm and dry.    ED Course  Procedures (including critical care time) Labs Review Labs Reviewed  CBC WITH DIFFERENTIAL/PLATELET  COMPREHENSIVE METABOLIC PANEL  URINE RAPID DRUG SCREEN, HOSP PERFORMED  CBG MONITORING, ED    Imaging Review No results found. I have personally reviewed and evaluated these images and lab results as part of my medical decision-making.   EKG Interpretation None      MDM   Final diagnoses:  None  Sore throat and dysphagia  Patient has a history of caustic injury to the esophagus and was admitted a month ago. At the time she underwent EGD and dilation of esophagus. Patient has not yet followed-up with GI since her hospital discharge on 12/20/14 due to scheduling issues. States she is scheduled for an outpatient EGD + dilation on 12/19. She is now presenting to the ED with a 3 day history of sore throat, dysphagia, retching, and chest area pain from esophageal spasms. Istat Troponin negative. Also reports having fevers, chills, body aches, and fatigue.  In addition, reports having difficulty swallowing and is retching. Vitals are  stable and patient is afebrile. No leukocytosis. She was slightly tachycardic (HR 99) and was given 1 L bolus of IVF. Patient does mention bringing up chunks of blood mixed with mucus when retching, however, Hgb is stable at 12.6. Hgb was 11.7 on 12/19/14. CXR normal.   She is hemodynamically stable and will be discharged home. Patient has been advised to go to her GI appointment on 12/19 and to continue taking her home medications Carafate and Omeprazole. Since she has difficulty swallowing, Omeprazole has been switched from tablet to an oral suspension. Advised to return to the ED or seek medical attention immediately if she starts having fevers, grossly bloody emesis, or increased pain/ discomfort in chest.       John Giovanni, MD 01/09/15  1556  John Giovanni, MD 01/09/15 1612  Margarita Grizzle, MD 01/10/15 [phone]

## 2015-01-18 ENCOUNTER — Ambulatory Visit (HOSPITAL_COMMUNITY): Admission: RE | Admit: 2015-01-18 | Payer: MEDICAID | Source: Ambulatory Visit | Admitting: Gastroenterology

## 2015-01-18 ENCOUNTER — Encounter (HOSPITAL_COMMUNITY): Admission: RE | Payer: Self-pay | Source: Ambulatory Visit

## 2015-01-18 ENCOUNTER — Telehealth: Payer: Self-pay | Admitting: Gastroenterology

## 2015-01-18 SURGERY — EGD (ESOPHAGOGASTRODUODENOSCOPY)
Anesthesia: General

## 2015-01-18 NOTE — Telephone Encounter (Signed)
The care partner for the patient is instructed. Patient will fast for 4 hours prior to the procedure and arrive at [facility] at 9:00 am on 01/20/15.

## 2015-01-18 NOTE — Telephone Encounter (Signed)
Lets try to schedule on Thurs 12/22 at [facility] with propofol and fluoro. Thanks

## 2015-01-18 NOTE — Telephone Encounter (Signed)
Wed is fine, thanks

## 2015-01-18 NOTE — Telephone Encounter (Signed)
No anesthesia on Tuesday and Thursday. Wednesday or Friday?

## 2015-01-18 NOTE — Telephone Encounter (Signed)
Patient is ready to schedule the follow up EGD. The procedure report stated "EGD 2-4 weeks (Patient will need to be scheduled [facility] with MAC, intubation and savary dilators)". This the patient that I had a returned letter on and no return calls. He is not able to eat well yet.  Do you want to try for a day this week?

## 2015-01-19 ENCOUNTER — Encounter (HOSPITAL_COMMUNITY): Payer: Self-pay | Admitting: *Deleted

## 2015-01-19 NOTE — Progress Notes (Signed)
I Instructed patient to not take any medications in am, because patient reported that he has to drink 2 cups of water before taking pills to help them go down.

## 2015-01-20 ENCOUNTER — Ambulatory Visit (HOSPITAL_COMMUNITY): Payer: 59 | Admitting: Certified Registered Nurse Anesthetist

## 2015-01-20 ENCOUNTER — Encounter (HOSPITAL_COMMUNITY): Payer: Self-pay

## 2015-01-20 ENCOUNTER — Inpatient Hospital Stay (HOSPITAL_COMMUNITY)
Admission: RE | Admit: 2015-01-20 | Discharge: 2015-01-20 | DRG: 392 | Disposition: A | Discharge status: Home / Self Care | Payer: 59 | Source: Ambulatory Visit | Attending: Family Medicine | Admitting: Family Medicine

## 2015-01-20 ENCOUNTER — Inpatient Hospital Stay (HOSPITAL_COMMUNITY): Payer: 59

## 2015-01-20 ENCOUNTER — Encounter (HOSPITAL_COMMUNITY)
Admission: RE | Disposition: A | Discharge status: Home / Self Care | Payer: Self-pay | Source: Ambulatory Visit | Attending: Gastroenterology

## 2015-01-20 DIAGNOSIS — F419 Anxiety disorder, unspecified: Secondary | ICD-10-CM | POA: Diagnosis present

## 2015-01-20 DIAGNOSIS — Z79899 Other long term (current) drug therapy: Secondary | ICD-10-CM

## 2015-01-20 DIAGNOSIS — F329 Major depressive disorder, single episode, unspecified: Secondary | ICD-10-CM | POA: Diagnosis present

## 2015-01-20 DIAGNOSIS — K222 Esophageal obstruction: Secondary | ICD-10-CM

## 2015-01-20 DIAGNOSIS — K589 Irritable bowel syndrome without diarrhea: Secondary | ICD-10-CM | POA: Diagnosis present

## 2015-01-20 DIAGNOSIS — R1310 Dysphagia, unspecified: Secondary | ICD-10-CM | POA: Diagnosis present

## 2015-01-20 DIAGNOSIS — F17210 Nicotine dependence, cigarettes, uncomplicated: Secondary | ICD-10-CM | POA: Diagnosis present

## 2015-01-20 HISTORY — DX: Pneumonia, unspecified organism: J18.9

## 2015-01-20 HISTORY — DX: Anxiety disorder, unspecified: F41.9

## 2015-01-20 HISTORY — PX: ESOPHAGOGASTRODUODENOSCOPY (EGD) WITH PROPOFOL: SHX5813

## 2015-01-20 SURGERY — ESOPHAGOGASTRODUODENOSCOPY (EGD) WITH PROPOFOL
Anesthesia: General

## 2015-01-20 MED ORDER — MEPERIDINE HCL 100 MG/ML IJ SOLN: 6.2500 mg | INTRAMUSCULAR | Status: DC | PRN

## 2015-01-20 MED ORDER — LACTATED RINGERS IV SOLN
INTRAVENOUS | Status: DC
  Administered 2015-01-20 (×2): via INTRAVENOUS

## 2015-01-20 MED ORDER — NALOXONE HCL 0.4 MG/ML IJ SOLN
INTRAMUSCULAR | Status: DC | PRN
  Administered 2015-01-20: .08 mg via INTRAVENOUS
  Administered 2015-01-20: .12 mg via INTRAVENOUS

## 2015-01-20 MED ORDER — PROMETHAZINE HCL 25 MG/ML IJ SOLN: 6.2500 mg | INTRAMUSCULAR | Status: DC | PRN

## 2015-01-20 MED ORDER — ONDANSETRON HCL 4 MG/2ML IJ SOLN: 4.0000 mg | Freq: Once | INTRAMUSCULAR | Status: DC | PRN

## 2015-01-20 MED ORDER — ONDANSETRON HCL 4 MG/2ML IJ SOLN
INTRAMUSCULAR | Status: DC | PRN
  Administered 2015-01-20: 4 mg via INTRAVENOUS

## 2015-01-20 MED ORDER — SODIUM CHLORIDE 0.9 % IV SOLN: INTRAVENOUS | Status: DC

## 2015-01-20 MED ORDER — LIDOCAINE HCL 4 % EX SOLN
CUTANEOUS | Status: DC | PRN
  Administered 2015-01-20: 4 mL via TOPICAL

## 2015-01-20 MED ORDER — FENTANYL CITRATE (PF) 100 MCG/2ML IJ SOLN: 25.0000 ug | INTRAMUSCULAR | Status: DC | PRN

## 2015-01-20 MED ORDER — MIDAZOLAM HCL 5 MG/5ML IJ SOLN
INTRAMUSCULAR | Status: DC | PRN
  Administered 2015-01-20: 2 mg via INTRAVENOUS

## 2015-01-20 MED ORDER — PHENYLEPHRINE HCL 10 MG/ML IJ SOLN
INTRAMUSCULAR | Status: DC | PRN
  Administered 2015-01-20: 40 ug via INTRAVENOUS

## 2015-01-20 MED ORDER — FENTANYL CITRATE (PF) 100 MCG/2ML IJ SOLN
INTRAMUSCULAR | Status: DC | PRN
  Administered 2015-01-20: 50 ug via INTRAVENOUS
  Administered 2015-01-20: 100 ug via INTRAVENOUS
  Administered 2015-01-20 (×2): 50 ug via INTRAVENOUS

## 2015-01-20 MED ORDER — HYDROMORPHONE HCL 1 MG/ML IJ SOLN: 0.2500 mg | INTRAMUSCULAR | Status: DC | PRN

## 2015-01-20 MED ORDER — PROPOFOL 10 MG/ML IV BOLUS
INTRAVENOUS | Status: DC | PRN
  Administered 2015-01-20: 150 mg via INTRAVENOUS

## 2015-01-20 MED ORDER — SUCCINYLCHOLINE CHLORIDE 20 MG/ML IJ SOLN
INTRAMUSCULAR | Status: DC | PRN
  Administered 2015-01-20: 100 mg via INTRAVENOUS

## 2015-01-20 NOTE — Anesthesia Preprocedure Evaluation (Signed)
Anesthesia Evaluation  Patient identified by MRN, date of birth, ID band Patient awake    Reviewed: Allergy & Precautions, NPO status , Patient's Chart, lab work & pertinent test results  Airway Mallampati: I  TM Distance: >3 FB Neck ROM: Full    Dental   Pulmonary Current Smoker,    Pulmonary exam normal        Cardiovascular Normal cardiovascular exam     Neuro/Psych    GI/Hepatic   Endo/Other    Renal/GU      Musculoskeletal   Abdominal   Peds  Hematology   Anesthesia Other Findings   Reproductive/Obstetrics                             Anesthesia Physical Anesthesia Plan  ASA: II  Anesthesia Plan: General   Post-op Pain Management:    Induction: Intravenous, Rapid sequence and Cricoid pressure planned  Airway Management Planned: Oral ETT  Additional Equipment:   Intra-op Plan:   Post-operative Plan: Extubation in OR  Informed Consent: I have reviewed the patients History and Physical, chart, labs and discussed the procedure including the risks, benefits and alternatives for the proposed anesthesia with the patient or authorized representative who has indicated his/her understanding and acceptance.     Plan Discussed with: CRNA and Surgeon  Anesthesia Plan Comments:         Anesthesia Quick Evaluation  

## 2015-01-20 NOTE — Anesthesia Procedure Notes (Signed)
Procedure Name: Intubation Performed by: Everlene BallsHAYES, Ambra Haverstick T Pre-anesthesia Checklist: Patient identified, Emergency Drugs available, Suction available, Patient being monitored and Timeout performed Patient Re-evaluated:Patient Re-evaluated prior to inductionOxygen Delivery Method: Circle system utilized Preoxygenation: Pre-oxygenation with 100% oxygen Intubation Type: IV induction Laryngoscope Size: Mac, 3 and McGraph Grade View: Grade I Tube type: Oral Tube size: 7.5 mm Number of attempts: 1 Airway Equipment and Method: Stylet Placement Confirmation: ETT inserted through vocal cords under direct vision,  positive ETCO2,  CO2 detector and breath sounds checked- equal and bilateral Secured at: 22 cm Tube secured with: Tape Dental Injury: Teeth and Oropharynx as per pre-operative assessment

## 2015-01-20 NOTE — Discharge Instructions (Signed)
Patient would [facility] to go [facility] to see if she could get esophageal dilation there.  Informed patient that Dr Enrigue CatenaShaheen is the esophageal expert at [facility]  Esophagogastroduodenoscopy, Care After Refer to this sheet in the next few weeks. These instructions provide you with information about caring for yourself after your procedure. Your health care provider may also give you more specific instructions. Your treatment has been planned according to current medical practices, but problems sometimes occur. Call your health care provider if you have any problems or questions after your procedure. WHAT TO EXPECT AFTER THE PROCEDURE After your procedure, it is typical to feel:  Soreness in your throat.  Pain with swallowing.  Sick to your stomach (nauseous).  Bloated.  Dizzy.  Fatigued. HOME CARE INSTRUCTIONS  Do not eat or drink anything until the numbing medicine (local anesthetic) has worn off and your gag reflex has returned. You will know that the local anesthetic has worn off when you can swallow comfortably.  Do not drive or operate machinery until directed by your health care provider.  Take medicines only as directed by your health care provider. SEEK MEDICAL CARE IF:   You cannot stop coughing.  You are not urinating at all or less than usual. SEEK IMMEDIATE MEDICAL CARE IF:  You have difficulty swallowing.  You cannot eat or drink.  You have worsening throat or chest pain.  You have dizziness or lightheadedness or you faint.  You have nausea or vomiting.  You have chills.  You have a fever.  You have severe abdominal pain.  You have black, tarry, or bloody stools.   This information is not intended to replace advice given to you by your health care provider. Make sure you discuss any questions you have with your health care provider.   Document Released: 01/03/2012 Document Revised: 02/06/2014 Document Reviewed: 01/03/2012 Elsevier Interactive Patient Education  [employer]vier Inc.

## 2015-01-20 NOTE — Transfer of Care (Signed)
Immediate Anesthesia Transfer of Care Note  Patient: Miguel RepressJuan Marrufo  Procedure(s) Performed: Procedure(s) with comments: ESOPHAGOGASTRODUODENOSCOPY (EGD) WITH PROPOFOL (N/A) - needs intubation  Patient Location: PACU  Anesthesia Type:General  Level of Consciousness: awake, patient cooperative and responds to stimulation  Airway & Oxygen Therapy: Patient Spontanous Breathing and Patient connected to nasal cannula oxygen  Post-op Assessment: Report given to RN and Post -op Vital signs reviewed and stable  Post vital signs: Reviewed and stable  Last Vitals:  Filed Vitals:   01/20/15 0934 01/20/15 1130  BP: 123/86 118/76  Pulse: 78 79  Temp: 37 C   Resp: 15 17    Complications: No apparent anesthesia complications

## 2015-01-20 NOTE — Anesthesia Postprocedure Evaluation (Signed)
Anesthesia Post Note  Patient: Miguel [name]  Procedure(s) Performed: Procedure(s) (LRB): ESOPHAGOGASTRODUODENOSCOPY (EGD) WITH PROPOFOL (N/A)  Patient location during evaluation: PACU Anesthesia Type: General Level of consciousness: awake and alert Pain management: pain level controlled Vital Signs Assessment: post-procedure vital signs reviewed and stable Respiratory status: spontaneous breathing, nonlabored ventilation, respiratory function stable and patient connected to nasal cannula oxygen Cardiovascular status: blood pressure returned to baseline and stable Postop Assessment: no signs of nausea or vomiting Anesthetic complications: no    Last Vitals:  Filed Vitals:   01/20/15 1140 01/20/15 1150  BP: 108/63 111/82  Pulse: 78 63  Temp:    Resp: 16 16    Last Pain:  Filed Vitals:   01/20/15 1152  PainSc: 4                  Miguel [name]

## 2015-01-20 NOTE — Op Note (Addendum)
[facility] [address] [place], [id number]   ENDOSCOPY PROCEDURE REPORT  PATIENT: Miguel [name], Miguel [name]  MR#: [id number] BIRTHDATE: 04-02-1990 , 24  yrs. old GENDER: male ENDOSCOPIST: Marsa ArisKavitha Vanassa Penniman, MD REFERRED BY:  [facility] PROCEDURE DATE:  01/20/2015 PROCEDURE:  EGD, diagnostic ASA CLASS:     Class III INDICATIONS:  dysphagia. MEDICATIONS: Monitored anesthesia care TOPICAL ANESTHETIC: none  DESCRIPTION OF PROCEDURE: After the risks benefits and alternatives of the procedure were thoroughly explained, informed consent was obtained.  The Pentax Gastroscope Peds [id number] endoscope was introduced through the mouth and advanced to the second portion of the duodenum , Without limitations.  The instrument was slowly withdrawn as the mucosa was fully examined.   Tight proximal esophageal stricture at 20 cm from incisors, could not pass the pediatric upper endoscope with 5.5 mm diameter; attempted to pass Savary guidewire under fluoroscopy but felt the lumen was narrow to accommodate the <2.4 mm guidewire. The scope was then withdrawn from the patient and the procedure completed.  COMPLICATIONS: There were no immediate complications.  ENDOSCOPIC IMPRESSION: Tight proximal esophageal stricture  RECOMMENDATIONS: NPO admit to hospitalist service Consults CT surgery TPN for nutritional support    eSigned:  Marsa ArisKavitha Aniylah Avans, MD 01/20/2015 12:39 PM Revised: 01/20/2015 12:39 PM

## 2015-01-20 NOTE — H&P (Signed)
[facility] History and Physical   Primary Care Physician:  No PCP Per Patient   Reason for Procedure:   Dysphagia, long esophageal stricture Plan:    EGD with savary dilation of the esophageal stricture The risks and benefits as well as alternatives of endoscopic procedure(s) have been discussed and reviewed. All questions answered. The patient agrees to proceed.    HPI: Miguel RepressJuan [name] is a 24 y.o. 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    Past Medical History  Diagnosis Date  . Depression   . IBS (irritable bowel syndrome)   . 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 Polysubstance abuse 07/2014  . Depression 07/2014.  Marland Kitchen. Serotonin syndrome 07/2014  . Pneumonia     As a child  . Anxiety     Past Surgical History  Procedure Laterality Date  . Appendectomy    . Appendectomy  age 24  . Esophagogastroduodenoscopy N/A 12/10/2014    Procedure: ESOPHAGOGASTRODUODENOSCOPY (EGD);  Surgeon: Napoleon FormKavitha Reynoldo Mainer V, MD;  [facility];  Service: Endoscopy;  Laterality: N/A;  . Esophagogastroduodenoscopy N/A 12/18/2014    Procedure: ESOPHAGOGASTRODUODENOSCOPY (EGD);  Surgeon: Napoleon FormKavitha Weda Baumgarner V, MD;  [facility];  Service: Endoscopy;  Laterality: N/A;  . Balloon dilation N/A 12/18/2014    Procedure: BALLOON DILATION;  Surgeon: Napoleon FormKavitha Garnell Begeman V, MD;  Location: [facility] ENDOSCOPY;  Service: Endoscopy;  Laterality: N/A;    Prior to Admission medications   Medication Sig Start Date End Date Taking? Authorizing Provider  estradiol (ESTRACE) 1 MG tablet Take 2-3 mg by mouth daily. Takes two in the am and thre tablets at night.   Yes Historical Provider, MD  Morphine Sulfate (MORPHINE CONCENTRATE) 10 MG/0.5ML SOLN concentrated solution Take 0.25 mLs (5 mg total) by mouth every 4 (four) hours as needed for severe pain. 12/20/14  Yes Hollice EspySendil K Krishnan, MD  omeprazole (PRILOSEC) 2 mg/mL  SUSP Take 10 mLs (20 mg total) by mouth 2 (two) times daily. 01/09/15  Yes John GiovanniVasundhra Rathore, MD  sucralfate (CARAFATE) 1 GM/10ML suspension Take 10 mLs (1 g total) by mouth 4 (four) times daily -  with meals and at bedtime. 12/20/14  Yes Hollice EspySendil K Krishnan, MD  Amino Acids-Protein Hydrolys (FEEDING SUPPLEMENT, PRO-STAT SUGAR FREE 64,) LIQD Take 30 mLs by mouth 3 (three) times daily. 12/20/14   Hollice EspySendil K Krishnan, MD  belladonna-PHENObarbital (DONNATAL) 16.2 MG/5ML ELIX Take 5 mLs by mouth 2 (two) times daily as needed for cramping.    Historical Provider, MD  Multiple Vitamin (MULTIVITAMIN) LIQD Take 5 mLs by mouth daily. 12/20/14   Hollice EspySendil K Krishnan, MD  sertraline (ZOLOFT) 100 MG tablet Take 200 mg by mouth daily.  11/22/14   Historical Provider, MD  spironolactone (ALDACTONE) 100 MG tablet Take 100 mg by mouth 2 (two) times daily.    Historical Provider, MD    Current Facility-Administered Medications  Medication Dose Route Frequency Provider Last Rate Last Dose  . 0.9 %  sodium chloride infusion   Intravenous Continuous Napoleon FormKavitha Jewelle Whitner V, MD        Allergies as of 01/18/2015  . (No Known Allergies)    Family History  Problem Relation Age of Onset  . Paranoid behavior Father     Social History   Social History  . Marital Status: Single    Spouse Name: N/A  . Number of Children: N/A  . Years of Education: N/A   Occupational History  .  Not on file.   Social History Main Topics  . Smoking status: Current Some Day Smoker  . Smokeless tobacco: Not on file     Comment: 1 cigarette a week  . Alcohol Use: 0.6 oz/week    0 Standard drinks or equivalent, 1 Glasses of wine per week     Comment: 2 glass, 2 times a week  . Drug Use: Yes    Special: Marijuana     Comment: marijuana, mushrooms, benzodiazepines, very occasionally last time 01/18/15  . Sexual Activity: Yes    Birth Control/ Protection: Condom   Other Topics Concern  . Not on file   Social History Narrative   **  Merged History Encounter **        Review of Systems: Positive for dysphagia All other review of systems negative except as mentioned in the HPI.  Physical Exam: Vital signs in last 24 hours: Temp:  [98.6 F (37 C)] 98.6 F (37 C) (12/21 0934) Pulse Rate:  [78] 78 (12/21 0934) Resp:  [15] 15 (12/21 0934) BP: (123)/(86) 123/86 mmHg (12/21 0934) SpO2:  [100 %] 100 % (12/21 0934) Weight:  [143 lb (64.864 kg)] 143 lb (64.864 kg) (12/21 0934)   General:   Alert,  Well-developed, well-nourished, pleasant and cooperative in NAD Lungs:  Clear throughout to auscultation.   Heart:  Regular rate and rhythm; no murmurs, clicks, rubs,  or gallops. Abdomen:  Soft, nontender and nondistended. Normal bowel sounds.   Neuro/Psych:  Alert and cooperative. Normal mood and affect. A and O x 3   .Scherry Ran, MD [facility] [phone] (pager) 01/20/2015 9:47 AM@

## 2015-01-21 ENCOUNTER — Encounter (HOSPITAL_COMMUNITY): Payer: Self-pay | Admitting: Gastroenterology

## 2015-02-23 ENCOUNTER — Ambulatory Visit: Payer: Self-pay | Admitting: Gastroenterology

## 2015-06-22 ENCOUNTER — Emergency Department (HOSPITAL_COMMUNITY): Payer: 59

## 2015-06-22 ENCOUNTER — Encounter (HOSPITAL_COMMUNITY): Payer: Self-pay | Admitting: Emergency Medicine

## 2015-06-22 ENCOUNTER — Emergency Department (HOSPITAL_COMMUNITY)
Admission: EM | Admit: 2015-06-22 | Discharge: 2015-06-23 | Disposition: A | Discharge status: Other Acute Inpatient Hospital | Payer: 59 | Attending: Emergency Medicine | Admitting: Emergency Medicine

## 2015-06-22 DIAGNOSIS — Z8719 Personal history of other diseases of the digestive system: Secondary | ICD-10-CM | POA: Insufficient documentation

## 2015-06-22 DIAGNOSIS — Z79899 Other long term (current) drug therapy: Secondary | ICD-10-CM | POA: Insufficient documentation

## 2015-06-22 DIAGNOSIS — Z8701 Personal history of pneumonia (recurrent): Secondary | ICD-10-CM | POA: Diagnosis not present

## 2015-06-22 DIAGNOSIS — F419 Anxiety disorder, unspecified: Secondary | ICD-10-CM | POA: Diagnosis not present

## 2015-06-22 DIAGNOSIS — F17200 Nicotine dependence, unspecified, uncomplicated: Secondary | ICD-10-CM | POA: Diagnosis not present

## 2015-06-22 DIAGNOSIS — Z8669 Personal history of other diseases of the nervous system and sense organs: Secondary | ICD-10-CM | POA: Diagnosis not present

## 2015-06-22 DIAGNOSIS — Z9089 Acquired absence of other organs: Secondary | ICD-10-CM | POA: Diagnosis not present

## 2015-06-22 DIAGNOSIS — F329 Major depressive disorder, single episode, unspecified: Secondary | ICD-10-CM | POA: Diagnosis not present

## 2015-06-22 DIAGNOSIS — R1033 Periumbilical pain: Secondary | ICD-10-CM | POA: Insufficient documentation

## 2015-06-22 DIAGNOSIS — R064 Hyperventilation: Secondary | ICD-10-CM | POA: Diagnosis not present

## 2015-06-22 DIAGNOSIS — R000 Tachycardia, unspecified: Secondary | ICD-10-CM | POA: Insufficient documentation

## 2015-06-22 DIAGNOSIS — R748 Abnormal levels of other serum enzymes: Secondary | ICD-10-CM | POA: Diagnosis not present

## 2015-06-22 LAB — COMPREHENSIVE METABOLIC PANEL
ALBUMIN: 3.7 g/dL (ref 3.5–5.0)
ALT: 20 U/L (ref 17–63)
ANION GAP: 13 (ref 5–15)
AST: 21 U/L (ref 15–41)
Alkaline Phosphatase: 162 U/L — ABNORMAL HIGH (ref 38–126)
BUN: 8 mg/dL (ref 6–20)
CHLORIDE: 99 mmol/L — AB (ref 101–111)
CO2: 23 mmol/L (ref 22–32)
CREATININE: 0.76 mg/dL (ref 0.61–1.24)
Calcium: 9.5 mg/dL (ref 8.9–10.3)
GFR calc non Af Amer: >60 mL/min (ref >60)
Glucose, Bld: 129 mg/dL — ABNORMAL HIGH (ref 65–99)
Potassium: 3.8 mmol/L (ref 3.5–5.1)
SODIUM: 135 mmol/L (ref 135–145)
Total Bilirubin: 0.2 mg/dL — ABNORMAL LOW (ref 0.3–1.2)
Total Protein: 8.5 g/dL — ABNORMAL HIGH (ref 6.5–8.1)

## 2015-06-22 LAB — CBC WITH DIFFERENTIAL/PLATELET
BASOS PCT: 1 %
Basophils Absolute: 0.1 10*3/uL (ref 0.0–0.1)
EOS ABS: 0.3 10*3/uL (ref 0.0–0.7)
EOS PCT: 3 %
HCT: 37.8 % — ABNORMAL LOW (ref 39.0–52.0)
Hemoglobin: 12.3 g/dL — ABNORMAL LOW (ref 13.0–17.0)
LYMPHS ABS: 2.2 10*3/uL (ref 0.7–4.0)
Lymphocytes Relative: 20 %
MCH: 29.5 pg (ref 26.0–34.0)
MCHC: 32.5 g/dL (ref 30.0–36.0)
MCV: 90.6 fL (ref 78.0–100.0)
Monocytes Absolute: 0.8 10*3/uL (ref 0.1–1.0)
Monocytes Relative: 7 %
Neutro Abs: 7.8 10*3/uL — ABNORMAL HIGH (ref 1.7–7.7)
Neutrophils Relative %: 69 %
PLATELETS: 896 10*3/uL — AB (ref 150–400)
RBC: 4.17 MIL/uL — AB (ref 4.22–5.81)
RDW: 12.7 % (ref 11.5–15.5)
WBC: 11.2 10*3/uL — AB (ref 4.0–10.5)

## 2015-06-22 LAB — I-STAT CG4 LACTIC ACID, ED: Lactic Acid, Venous: 5.01 mmol/L (ref 0.5–2.0)

## 2015-06-22 LAB — LIPASE, BLOOD: LIPASE: 258 U/L — AB (ref 11–51)

## 2015-06-22 MED ORDER — IOPAMIDOL (ISOVUE-370) INJECTION 76%
INTRAVENOUS | Status: AC
  Administered 2015-06-23: 100 mL
  Filled 2015-06-22: qty 100

## 2015-06-22 MED ORDER — SODIUM CHLORIDE 0.9 % IV BOLUS (SEPSIS)
1000.0000 mL | Freq: Once | INTRAVENOUS | Status: AC
  Administered 2015-06-22: 1000 mL via INTRAVENOUS

## 2015-06-22 MED ORDER — IOPAMIDOL (ISOVUE-300) INJECTION 61%
INTRAVENOUS | Status: AC
  Filled 2015-06-22: qty 100

## 2015-06-22 MED ORDER — ONDANSETRON HCL 4 MG/2ML IJ SOLN
4.0000 mg | Freq: Once | INTRAMUSCULAR | Status: AC
  Administered 2015-06-22: 4 mg via INTRAVENOUS
  Filled 2015-06-22: qty 2

## 2015-06-22 MED ORDER — HYDROMORPHONE HCL 1 MG/ML IJ SOLN
1.0000 mg | Freq: Once | INTRAMUSCULAR | Status: AC
  Administered 2015-06-22: 1 mg via INTRAVENOUS
  Filled 2015-06-22: qty 1

## 2015-06-22 MED ORDER — LORAZEPAM 2 MG/ML IJ SOLN
1.0000 mg | Freq: Once | INTRAMUSCULAR | Status: AC
  Administered 2015-06-22: 1 mg via INTRAVENOUS
  Filled 2015-06-22: qty 1

## 2015-06-22 NOTE — ED Notes (Signed)
Pt started having severe mid abd pain after eating.  Nausea without vomiting.

## 2015-06-22 NOTE — ED Provider Notes (Signed)
CSN: [id number]     Arrival date & time 06/22/15  2051 History   First MD Initiated Contact with Patient 06/22/15 2131     Chief Complaint  Patient presents with  . Abdominal Pain     (Consider location/radiation/quality/duration/timing/severity/associated sxs/prior Treatment) HPI Comments: Patient presents with severe lower abdominal pain onset after eating dinner tonight. Describes pain near his umbilicus that is constant. No nausea or vomiting. No fever. Pain is ongoing for the past one hour. Notably patient had [facility] for esophageal strict[facility]. He has not followed up with his surgeon. Denies any chest pain or shortness of breath.  Level V caveat for acuity of condition. Patient is hyperventilating and anxious.  The history is provided by the patient. The history is limited by the condition of the patient.    Past Medical History  Diagnosis Date  . Depression   . IBS (irritable bowel syndrome)   . 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 Polysubstance abuse 07/2014  . Depression 07/2014.  Marland Kitchen. Serotonin syndrome 07/2014  . Pneumonia     As a child  . Anxiety    Past Surgical History  Procedure Laterality Date  . Appendectomy    . Appendectomy  age 25  . Esophagogastroduodenoscopy N/A 12/10/2014    Procedure: ESOPHAGOGASTRODUODENOSCOPY (EGD);  Surgeon: Napoleon FormKavitha Nandigam V, MD;  [facility];  Service: Endoscopy;  Laterality: N/A;  . Esophagogastroduodenoscopy N/A 12/18/2014    Procedure: ESOPHAGOGASTRODUODENOSCOPY (EGD);  Surgeon: Napoleon FormKavitha Nandigam V, MD;  [facility];  Service: Endoscopy;  Laterality: N/A;  . Balloon dilation N/A 12/18/2014    Procedure: BALLOON DILATION;  Surgeon: Napoleon FormKavitha Nandigam V, MD;  Location: [facility] ENDOSCOPY;  Service: Endoscopy;  Laterality: N/A;  . Esophagogastroduodenoscopy (egd) with propofol N/A 01/20/2015    Procedure: ESOPHAGOGASTRODUODENOSCOPY (EGD) WITH PROPOFOL;  Surgeon: Napoleon FormKavitha Nandigam V,  MD;  Location: [facility] ENDOSCOPY;  Service: Endoscopy;  Laterality: N/A;  needs intubation   Family History  Problem Relation Age of Onset  . Paranoid behavior Father    Social History  Substance Use Topics  . Smoking status: Current Some Day Smoker  . Smokeless tobacco: None     Comment: 1 cigarette a week  . Alcohol Use: 0.6 oz/week    0 Standard drinks or equivalent, 1 Glasses of wine per week     Comment: 2 glass, 2 times a week    Review of Systems  Unable to perform ROS: Acuity of condition  Gastrointestinal: Positive for abdominal pain.      Allergies  Review of patient's allergies indicates no known allergies.  Home Medications   Prior to Admission medications   Medication Sig Start Date End Date Taking? Authorizing Provider  estradiol (ESTRACE) 1 MG tablet Take 2-3 mg by mouth daily. Takes two in the am and thre tablets at night.   Yes Historical Provider, MD  spironolactone (ALDACTONE) 100 MG tablet Take 100 mg by mouth 2 (two) times daily.   Yes Historical Provider, MD  traMADol (ULTRAM) 50 MG tablet Take 50 mg by mouth every 6 (six) hours as needed for moderate pain.   Yes Historical Provider, MD  Amino Acids-Protein Hydrolys (FEEDING SUPPLEMENT, PRO-STAT SUGAR FREE 64,) LIQD Take 30 mLs by mouth 3 (three) times daily. Patient not taking: Reported on 06/22/2015 12/20/14   Hollice EspySendil K Krishnan, MD  Multiple Vitamin (MULTIVITAMIN) LIQD Take 5 mLs by mouth daily. Patient not taking: Reported on 06/22/2015 12/20/14   Sendil K  Rito Ehrlich, MD  omeprazole (PRILOSEC) 2 mg/mL SUSP Take 10 mLs (20 mg total) by mouth 2 (two) times daily. Patient not taking: Reported on 06/22/2015 01/09/15   John Giovanni, MD   BP 123/87 mmHg  Pulse 107  Temp(Src) 97.5 F (36.4 C) (Oral)  Resp 26  SpO2 99% Physical Exam  Constitutional: He is oriented to person, place, and time. He appears well-developed and well-nourished. He appears distressed.  Hyperventilating, dry mucous membranes  HENT:   Head: Normocephalic and atraumatic.  Mouth/Throat: Oropharynx is clear and moist. No oropharyngeal exudate.  Eyes: Conjunctivae and EOM are normal. Pupils are equal, round, and reactive to light.  Neck: Normal range of motion. Neck supple.  Cardiovascular: Normal rate and normal heart sounds.   tachycardic  Pulmonary/Chest: Effort normal and breath sounds normal. No respiratory distress.  Abdominal: Soft. There is tenderness. There is guarding.  Difficult exam, patient will not allow proper exam. Periumbilical tenderness with guarding. No rebound.  Musculoskeletal: Normal range of motion. He exhibits no edema or tenderness.  Neurological: He is alert and oriented to person, place, and time. No cranial nerve deficit. He exhibits normal muscle tone. Coordination normal.  Skin: Skin is warm.    ED Course  Procedures (including critical care time) Labs Review Labs Reviewed  CBC WITH DIFFERENTIAL/PLATELET - Abnormal; Notable for the following:    WBC 11.2 (*)    RBC 4.17 (*)    Hemoglobin 12.3 (*)    HCT 37.8 (*)    Platelets 896 (*)    Neutro Abs 7.8 (*)    All other components within normal limits  COMPREHENSIVE METABOLIC PANEL - Abnormal; Notable for the following:    Chloride 99 (*)    Glucose, Bld 129 (*)    Total Protein 8.5 (*)    Alkaline Phosphatase 162 (*)    Total Bilirubin 0.2 (*)    All other components within normal limits  LIPASE, BLOOD - Abnormal; Notable for the following:    Lipase 258 (*)    All other components within normal limits  I-STAT CG4 LACTIC ACID, ED - Abnormal; Notable for the following:    Lactic Acid, Venous 5.01 (*)    All other components within normal limits  URINALYSIS, ROUTINE W REFLEX MICROSCOPIC (NOT AT [facility])  I-STAT CG4 LACTIC ACID, ED    Imaging Review Dg Abd Acute W/chest  06/22/2015  CLINICAL DATA:  Abdominal pain. Post esophagectomy three weeks prior. Acute onset of epigastric pain today. EXAM: DG ABDOMEN ACUTE W/ 1V CHEST  COMPARISON:  Preoperative chest radiographs 01/09/2015 FINDINGS: Postsurgical change in the mediastinum with curvilinear air-filled structure on the right consistent with gastric pull-through post esophagectomy. The heart size is normal. There is no consolidation or pleural effusion. No pneumothorax. No free intra-abdominal air. No dilated small bowel loops. There is a moderate diffuse stool burden throughout the colon. Questionable minimal residual contrast within the descending and sigmoid colon. No radiopaque calculi are seen. IMPRESSION: 1. 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 No evidence of bowel obstruction or free air. There is a moderate diffuse stool burden. 2. Clear lungs. Electronically Signed   By: Rubye Oaks M.D.   On: 06/22/2015 22:42   I have personally reviewed and evaluated these images and lab results as part of my medical decision-making.   EKG Interpretation None      MDM   Final diagnoses:  None   Acute onset of lower abdominal pain after esophagectomy 3 weeks ago. Patient in distress on  arrival, tachycardic and hyperventilating. Difficult abdominal exam due to distress.  Patient given IV fluids, pain and nausea medications. Labs obtained. Lactate is 5.0.  X-ray shows stable postsurgical changes with no obstruction or free air. There is a large stool burden. Lipase 258. LFTs normal. White count 11.  Lactate has cleared after 2 liters IVF.  BP and HR stable.  Abdomen without peritoneal signs.  CT of abdomen pending at time of sign out to Dr. Rhunette Croft.  Anticipate discussion of findings with thoracic surgery team (Dr. Jacqulyn Bath) at [facility]. Patient may require admission for possible pancreatitis verus other pathology.   Glynn Octave, MD 06/23/15 [phone]

## 2015-06-23 ENCOUNTER — Encounter (HOSPITAL_COMMUNITY): Payer: Self-pay | Admitting: Radiology

## 2015-06-23 ENCOUNTER — Emergency Department (HOSPITAL_COMMUNITY): Payer: 59

## 2015-06-23 DIAGNOSIS — R000 Tachycardia, unspecified: Secondary | ICD-10-CM | POA: Diagnosis not present

## 2015-06-23 DIAGNOSIS — Z79899 Other long term (current) drug therapy: Secondary | ICD-10-CM | POA: Diagnosis not present

## 2015-06-23 DIAGNOSIS — Z9089 Acquired absence of other organs: Secondary | ICD-10-CM | POA: Diagnosis not present

## 2015-06-23 DIAGNOSIS — R748 Abnormal levels of other serum enzymes: Secondary | ICD-10-CM | POA: Diagnosis not present

## 2015-06-23 DIAGNOSIS — F17200 Nicotine dependence, unspecified, uncomplicated: Secondary | ICD-10-CM | POA: Diagnosis not present

## 2015-06-23 DIAGNOSIS — F419 Anxiety disorder, unspecified: Secondary | ICD-10-CM | POA: Diagnosis not present

## 2015-06-23 DIAGNOSIS — Z8719 Personal history of other diseases of the digestive system: Secondary | ICD-10-CM | POA: Diagnosis not present

## 2015-06-23 DIAGNOSIS — F329 Major depressive disorder, single episode, unspecified: Secondary | ICD-10-CM | POA: Diagnosis not present

## 2015-06-23 DIAGNOSIS — Z8669 Personal history of other diseases of the nervous system and sense organs: Secondary | ICD-10-CM | POA: Diagnosis not present

## 2015-06-23 DIAGNOSIS — R1033 Periumbilical pain: Secondary | ICD-10-CM | POA: Diagnosis present

## 2015-06-23 DIAGNOSIS — Z8701 Personal history of pneumonia (recurrent): Secondary | ICD-10-CM | POA: Diagnosis not present

## 2015-06-23 DIAGNOSIS — R064 Hyperventilation: Secondary | ICD-10-CM | POA: Diagnosis not present

## 2015-06-23 LAB — I-STAT CG4 LACTIC ACID, ED: Lactic Acid, Venous: 1.23 mmol/L (ref 0.5–2.0)

## 2015-06-23 LAB — URINALYSIS, ROUTINE W REFLEX MICROSCOPIC
BILIRUBIN URINE: NEGATIVE
Glucose, UA: NEGATIVE mg/dL
Hgb urine dipstick: NEGATIVE
KETONES UR: NEGATIVE mg/dL
LEUKOCYTES UA: NEGATIVE
NITRITE: NEGATIVE
PH: 5.5 (ref 5.0–8.0)
PROTEIN: NEGATIVE mg/dL
Specific Gravity, Urine: 1.01 (ref 1.005–1.030)

## 2015-06-23 MED ORDER — HYDROMORPHONE HCL 1 MG/ML IJ SOLN
1.0000 mg | Freq: Once | INTRAMUSCULAR | Status: AC
  Administered 2015-06-23: 1 mg via INTRAVENOUS
  Filled 2015-06-23: qty 1

## 2015-06-23 MED ORDER — SODIUM CHLORIDE 0.9 % IV BOLUS (SEPSIS)
1000.0000 mL | Freq: Once | INTRAVENOUS | Status: AC
  Administered 2015-06-23: 1000 mL via INTRAVENOUS

## 2015-06-23 MED ORDER — FENTANYL CITRATE (PF) 100 MCG/2ML IJ SOLN
50.0000 ug | Freq: Once | INTRAMUSCULAR | Status: AC
  Administered 2015-06-23: 50 ug via INTRAVENOUS
  Filled 2015-06-23: qty 2

## 2015-06-23 MED ORDER — ONDANSETRON HCL 4 MG/2ML IJ SOLN
4.0000 mg | Freq: Once | INTRAMUSCULAR | Status: AC
  Administered 2015-06-23: 4 mg via INTRAVENOUS
  Filled 2015-06-23: qty 2

## 2015-06-23 NOTE — ED Provider Notes (Signed)
  Physical Exam  BP 124/75 mmHg  Pulse 99  Temp(Src) 97.5 F (36.4 C) (Oral)  Resp 22  SpO2 98%  Physical Exam  ED Course  Procedures  MDM  Pt comes in with acute onset of peri-umbilical pain. Hx of esophageal damage and surgery due to toxic ingestion. [facility].  Lactate > 5. Lipase > 200.  AAS - no acute changes.   2:22 AM CT results discussed with the patient. [facility] Dr. Debarah CrapeYen accepting. NG ordered. Lactate cleared.  Miguel KaplanAnkit Maggy Wyble, MD 06/23/15 [phone]

## 2015-06-24 MED FILL — Lorazepam Inj 2 MG/ML: INTRAMUSCULAR | Qty: 1 | Status: AC

## 2015-06-24 MED FILL — Hydromorphone HCl Inj 2 MG/ML: INTRAMUSCULAR | Qty: 1 | Status: AC

## 2016-10-01 IMAGING — RF DG ESOPHAGUS
9 of 11 series · 14 of 24 positions shown · IV contrast (omnipaque)
Comparison: CT of the chest on 12/10/2014

CLINICAL DATA: Ingestion of caustic material. Possible need for
enteric feeds. Able to drink water earlier today. Severe mid
esophageal stricture on upper endoscopy.

EXAM:
ESOPHOGRAM/BARIUM SWALLOW
TECHNIQUE: Single contrast examination was performed using Omnipaque followed
by thin barium liquid and tablet.
FLUOROSCOPY TIME:  Fluoroscopy Time:  1 minutes 30 seconds

[Series 1: cp_standard · 0.28mm/px · 1 of 1 slices shown (1 of 9)]
[im 1/1]
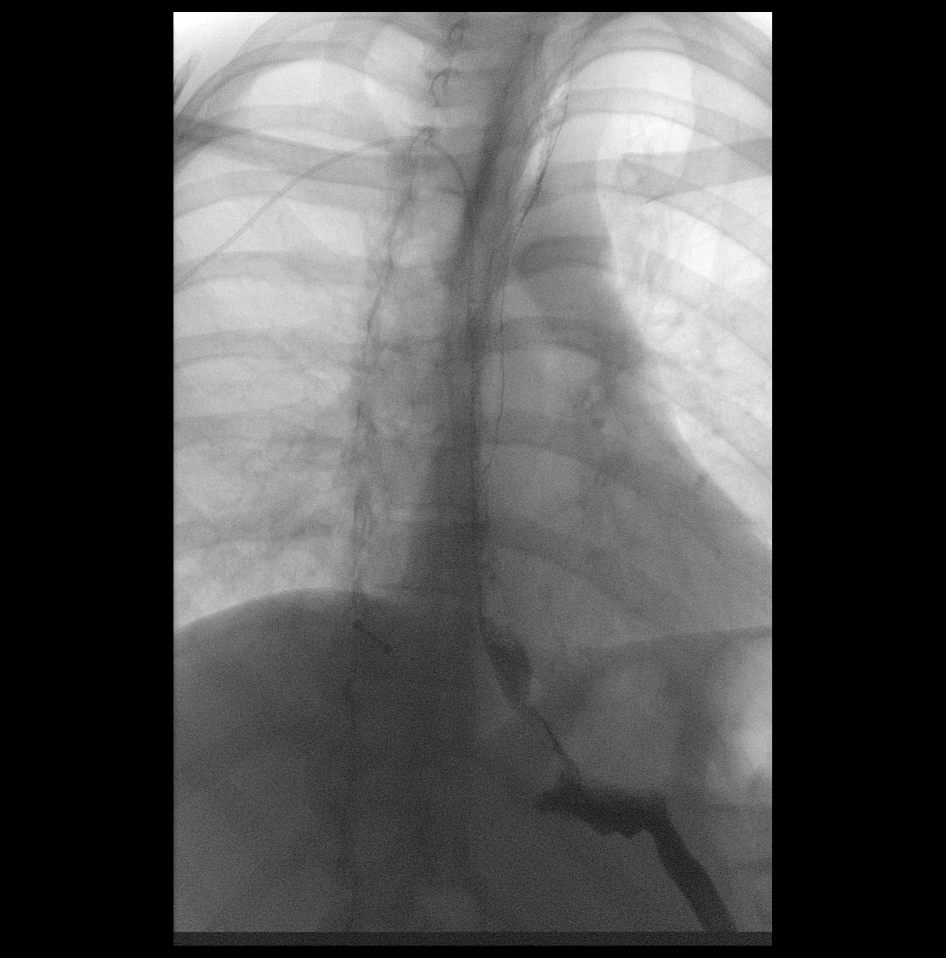

[Series 2: cp_standard · 0.57mm/px · 1 of 26 frames shown (2 of 9)]
[frame 14/26]
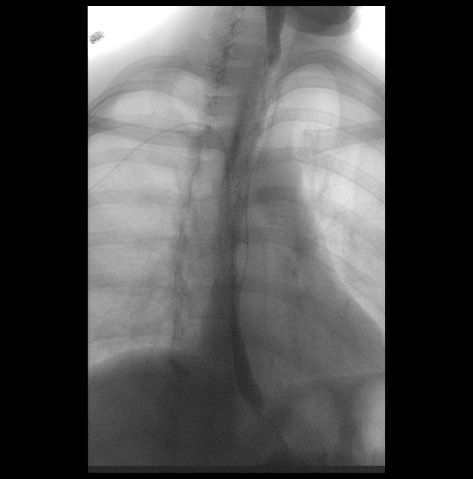

[Series 3: cp_standard · 0.57mm/px · 2 of 22 frames shown (3 of 9)]
[frame 3/22]
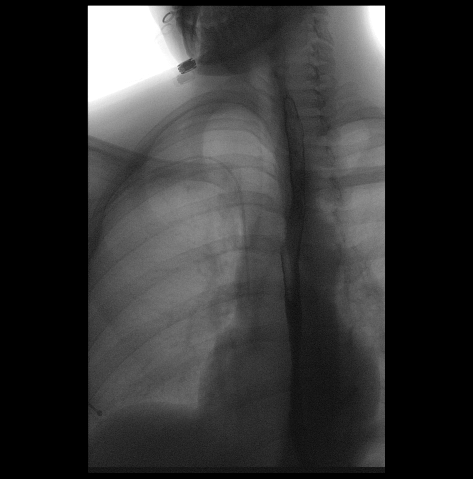
[frame 19/22]
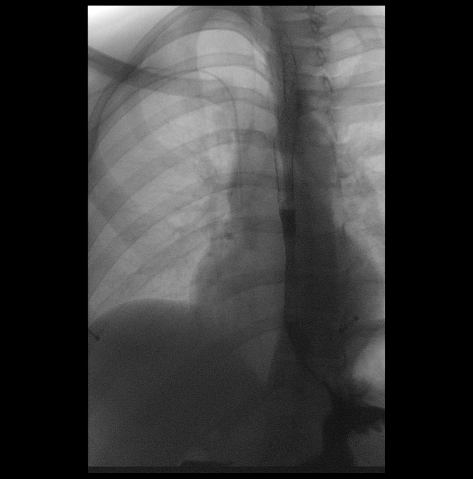

[Series 4: cp_standard · 0.56mm/px · 2 of 27 frames shown (4 of 9)]
[frame 1/27]
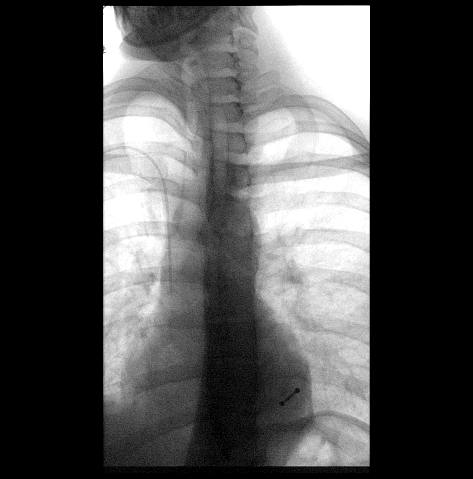
[frame 14/27]
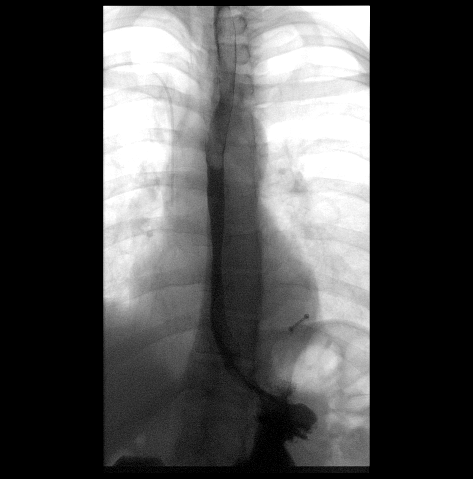

[Series 5: cp_standard · 0.57mm/px · 2 of 33 frames shown (5 of 9)]
[frame 17/33]
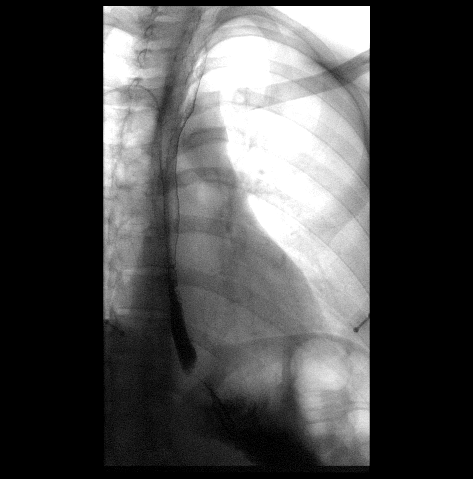
[frame 29/33]
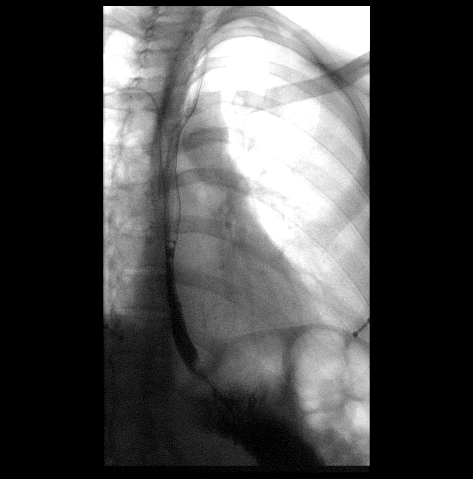

[Series 6: cp_standard · 0.57mm/px · 2 of 35 frames shown (6 of 9)]
[frame 18/35]
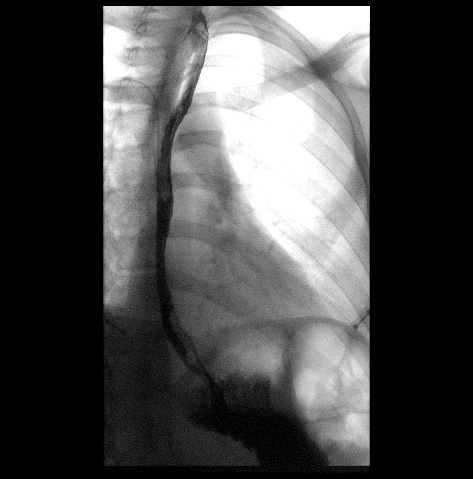
[frame 33/35]
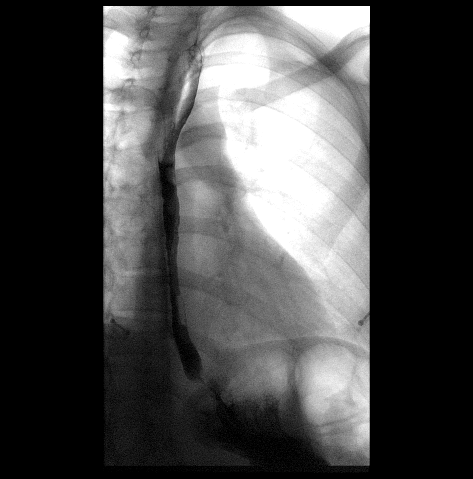

[Series 7: cp_standard · 0.56mm/px · 2 of 27 frames shown (7 of 9)]
[frame 22/27]
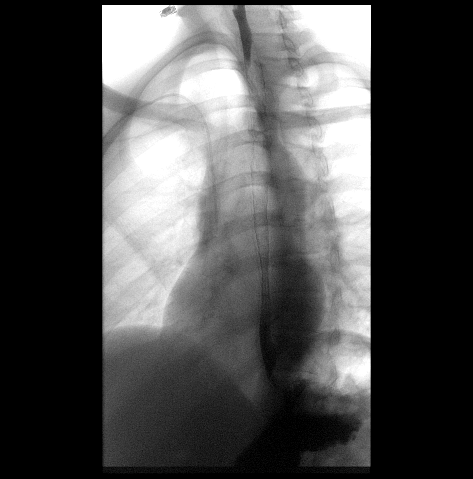
[frame 23/27]
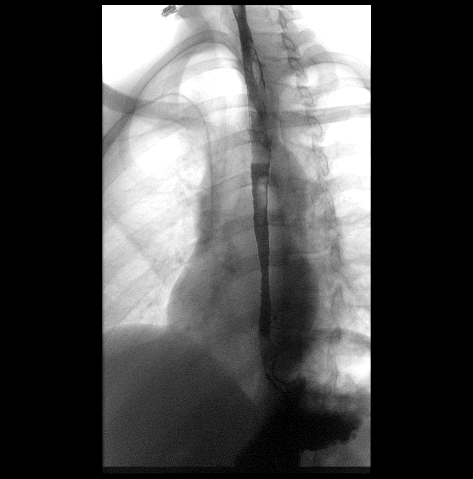

[Series 9: cp_standard · 0.29mm/px · 1 of 1 slices shown (8 of 9)]
[im 1/1]
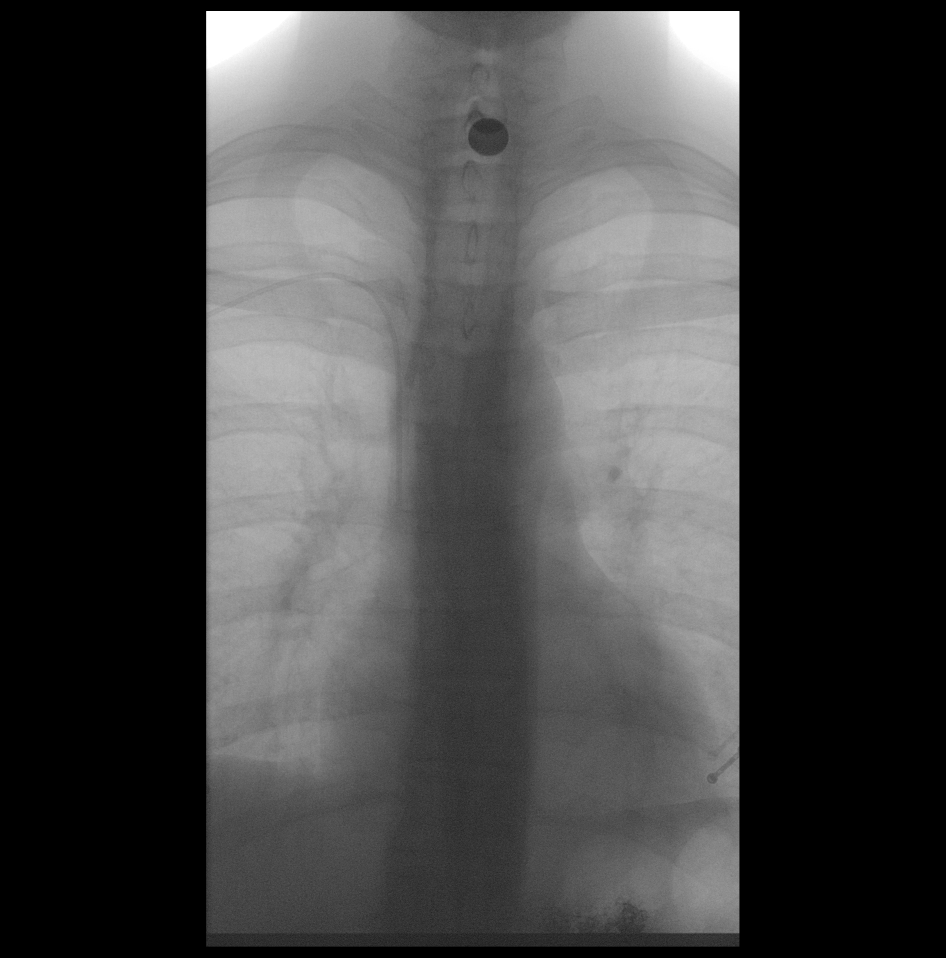

[Series 12: cp_standard · 0.29mm/px · 1 of 1 slices shown (9 of 9)]
[im 1/1]
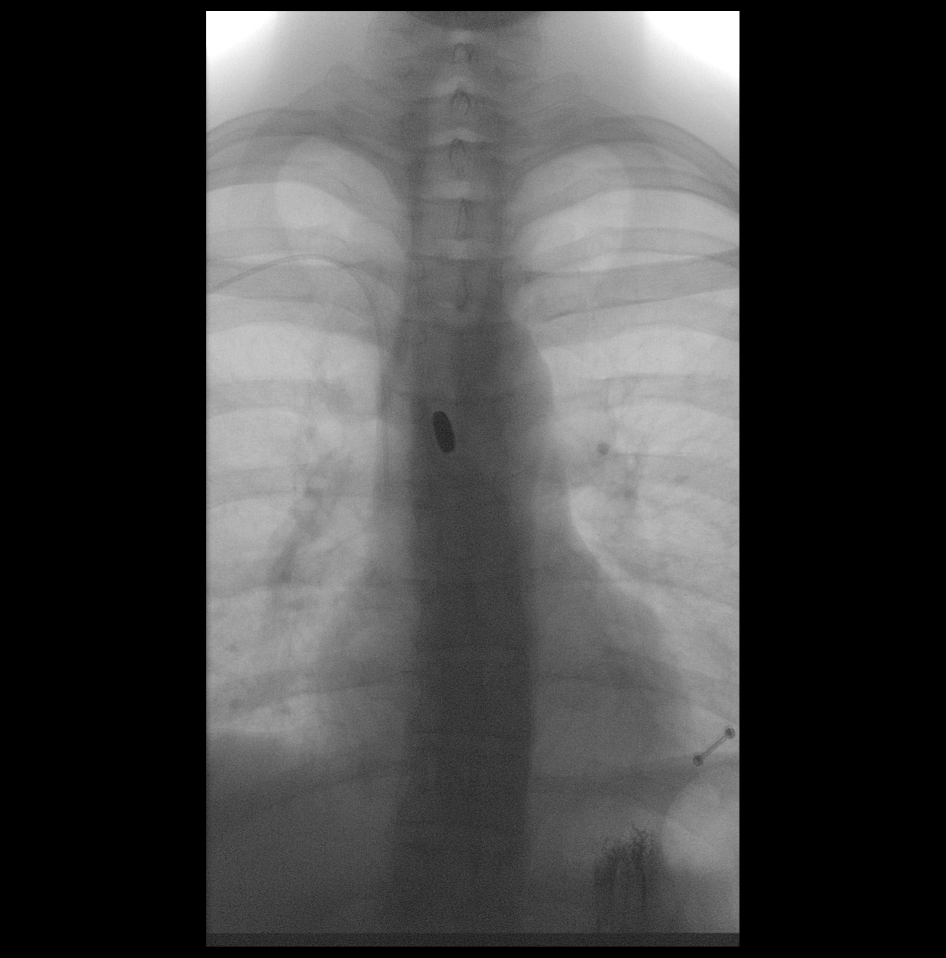

[14 of 24 positions shown; findings below may reference images not displayed]

FINDINGS: Initially sips of water were swell without difficulty. Subsequently
Omnipaque was administered and swallowed without difficulty. There
is no evidence for contrast leak. Barium was then administered.
There is a long segment esophageal stricture beginning at the level
of the aortic arch and extending to the gastroesophageal junction.
Barium tablet is transiently at the level of the thoracic inlet.
Subsequently barium tablet is upheld for the duration of the study
at the level of the aortic arch. The patient is not symptomatic with
the upheld barium tablet at this level.
IMPRESSION: Long segment lower esophageal stricture, beginning at the level of
the aortic arch.

The salient findings were discussed with Dr. Bunnarith on 12/14/2014 at
[DATE].
